# Patient Record
Sex: Female | Born: 1985 | Race: White | Hispanic: No | Marital: Single | Smoking: Never smoker
Health system: Southern US, Community
[De-identification: ages and names within clinical notes are randomized; demographics above are authoritative.]

## PROBLEM LIST (undated history)

## (undated) DIAGNOSIS — K9041 Non-celiac gluten sensitivity: Secondary | ICD-10-CM

## (undated) DIAGNOSIS — E739 Lactose intolerance, unspecified: Secondary | ICD-10-CM

## (undated) HISTORY — PX: TOOTH EXTRACTION: SUR596

## (undated) HISTORY — PX: NO PAST SURGERIES: SHX2092

---

## 2013-08-26 ENCOUNTER — Encounter (HOSPITAL_COMMUNITY): Payer: Self-pay | Admitting: Emergency Medicine

## 2013-08-26 ENCOUNTER — Emergency Department (HOSPITAL_COMMUNITY): Payer: Medicaid Other

## 2013-08-26 ENCOUNTER — Emergency Department (HOSPITAL_COMMUNITY)
Admission: EM | Admit: 2013-08-26 | Discharge: 2013-08-26 | Disposition: A | Discharge status: Home / Self Care | Payer: Medicaid Other | Attending: Emergency Medicine | Admitting: Emergency Medicine

## 2013-08-26 DIAGNOSIS — O200 Threatened abortion: Secondary | ICD-10-CM

## 2013-08-26 DIAGNOSIS — M549 Dorsalgia, unspecified: Secondary | ICD-10-CM | POA: Insufficient documentation

## 2013-08-26 LAB — CBC WITH DIFFERENTIAL/PLATELET
Basophils Relative: 1 % (ref 0–1)
Eosinophils Relative: 2 % (ref 0–5)
Hemoglobin: 13.2 g/dL (ref 12.0–15.0)
Lymphs Abs: 3.1 10*3/uL (ref 0.7–4.0)
MCH: 28.8 pg (ref 26.0–34.0)
MCV: 84.5 fL (ref 78.0–100.0)
Monocytes Absolute: 0.7 10*3/uL (ref 0.1–1.0)
Neutro Abs: 5.5 10*3/uL (ref 1.7–7.7)
Platelets: 288 10*3/uL (ref 150–400)
RBC: 4.59 MIL/uL (ref 3.87–5.11)
RDW: 13.5 % (ref 11.5–15.5)

## 2013-08-26 LAB — URINALYSIS, ROUTINE W REFLEX MICROSCOPIC
Glucose, UA: NEGATIVE mg/dL
Leukocytes, UA: NEGATIVE
Protein, ur: NEGATIVE mg/dL
Urobilinogen, UA: 0.2 mg/dL (ref 0.0–1.0)
pH: 7 (ref 5.0–8.0)

## 2013-08-26 LAB — WET PREP, GENITAL
Clue Cells Wet Prep HPF POC: NONE SEEN
Trich, Wet Prep: NONE SEEN
Yeast Wet Prep HPF POC: NONE SEEN

## 2013-08-26 LAB — ABO/RH

## 2013-08-26 LAB — HCG, QUANTITATIVE, PREGNANCY: hCG, Beta Chain, Quant, S: 1687 m[IU]/mL — ABNORMAL HIGH (ref <5)

## 2013-08-26 LAB — URINE MICROSCOPIC-ADD ON

## 2013-08-26 NOTE — ED Notes (Signed)
Pt c/o vaginal bleeding starting today; pt sts LMP was 07/17/13 and sts postive home pregnancy test on Tuesday; pt sts some clots

## 2013-08-26 NOTE — ED Notes (Signed)
Pt states she began passing clots today, denies any pain with the passage of clots and states she is not having any flow of blood. Pt states she found out on Monday that she was pregnant. Denies any pain at this time.

## 2013-08-26 NOTE — ED Provider Notes (Signed)
CSN: [id number]     Arrival date & time 08/26/13  1459 History   First MD Initiated Contact with Patient 08/26/13 1555     Chief Complaint  Patient presents with  . Vaginal Bleeding   (Consider location/radiation/quality/duration/timing/severity/associated sxs/prior Treatment) The history is provided by the patient. No language interpreter was used.    Ms Rachel Jackson is a 27 yo G2P1 female with a recent positive home pregnancy test who presents with vaginal bleeding. She first noticed some dark brown spotting when she wiped this morning after urinating. A few hours later, she noted some blood clots upon wiping. She called her PCP who told her to come to the ED. She is wearing a pad and has some vaginal bleeding still. Denies abdominal pain or cramping but has mild pressure in lower pelvic and lower back areas. She endorses nausea and increased urinary frequency.  No fever, chills, diarrhea, or constipation. No dysuria, vaginal itching, or rectal bleeding. No concerns for STIs. Her previous pregnancy and delivery was uncomplicated. No history of ectopic pregnancies or miscarriages. She has no chronic medical conditions and only takes a multivitamin.   History reviewed. No pertinent past medical history. History reviewed. No pertinent past surgical history. History reviewed. No pertinent family history. History  Substance Use Topics  . Smoking status: Never Smoker   . Smokeless tobacco: Not on file  . Alcohol Use: Yes     Comment: occ   OB History   Grav Para Term Preterm Abortions TAB SAB Ect Mult Living                 Review of Systems  Constitutional: Negative for fever and chills.  Respiratory: Negative.  Negative for shortness of breath.   Cardiovascular: Negative.  Negative for chest pain.  Gastrointestinal: Negative.  Negative for abdominal pain.  Genitourinary: Positive for vaginal bleeding. Negative for dysuria, vaginal discharge and pelvic pain.  Musculoskeletal:  Positive for back pain.  Neurological: Negative.  Negative for dizziness and light-headedness.    Allergies  Gluten meal; Lactose intolerance (gi); and Yellow dyes (non-tartrazine)  Home Medications   Current Outpatient Rx  Name  Route  Sig  Dispense  Refill  . Multiple Vitamin (MULTIVITAMIN WITH MINERALS) TABS tablet   Oral   Take 1 tablet by mouth 3 (three) times daily.          BP 133/70  Pulse 83  Temp(Src) 98.6 F (37 C) (Oral)  Resp 18  SpO2 97% Physical Exam  Vitals reviewed. Constitutional: She is oriented to person, place, and time. She appears well-developed and well-nourished. No distress.  HENT:  Head: Normocephalic.  Neck: Normal range of motion. Neck supple.  Cardiovascular: Normal rate, regular rhythm, S1 normal and S2 normal.   Pulmonary/Chest: Effort normal and breath sounds normal.  Abdominal: Soft. Normal appearance and bowel sounds are normal. There is no tenderness. There is no rebound and no guarding.  Genitourinary: Vagina normal and uterus normal. No vaginal discharge found.  Dark blood in vaginal vault, no clots or obvious POC. Cervix appears closed, non-tender. No adnexal tenderness or mass.   Musculoskeletal: Normal range of motion.  Lymphadenopathy:    She has no cervical adenopathy.  Neurological: She is alert and oriented to person, place, and time.  Skin: Skin is warm and dry.  Psychiatric: She has a normal mood and affect.    ED Course  Procedures (including critical care time) Labs Review Labs Reviewed  HCG, QUANTITATIVE, PREGNANCY - Abnormal; Notable for  the following:    hCG, Beta Chain, Quant, S 1687 (*)    All other components within normal limits  POCT PREGNANCY, URINE - Abnormal; Notable for the following:    Preg Test, Ur POSITIVE (*)    All other components within normal limits  URINALYSIS, ROUTINE W REFLEX MICROSCOPIC   Results for orders placed during the hospital encounter of 08/26/13  WET PREP, GENITAL      Result  Value Range   Yeast Wet Prep HPF POC NONE SEEN  NONE SEEN   Trich, Wet Prep NONE SEEN  NONE SEEN   Clue Cells Wet Prep HPF POC NONE SEEN  NONE SEEN   WBC, Wet Prep HPF POC FEW (*) NONE SEEN  GC/CHLAMYDIA PROBE AMP      Result Value Range   CT Probe RNA NEGATIVE  NEGATIVE   GC Probe RNA NEGATIVE  NEGATIVE  URINALYSIS, ROUTINE W REFLEX MICROSCOPIC      Result Value Range   Color, Urine YELLOW  YELLOW   APPearance CLEAR  CLEAR   Specific Gravity, Urine 1.010  1.005 - 1.030   pH 7.0  5.0 - 8.0   Glucose, UA NEGATIVE  NEGATIVE mg/dL   Hgb urine dipstick LARGE (*) NEGATIVE   Bilirubin Urine NEGATIVE  NEGATIVE   Ketones, ur NEGATIVE  NEGATIVE mg/dL   Protein, ur NEGATIVE  NEGATIVE mg/dL   Urobilinogen, UA 0.2  0.0 - 1.0 mg/dL   Nitrite NEGATIVE  NEGATIVE   Leukocytes, UA NEGATIVE  NEGATIVE  HCG, QUANTITATIVE, PREGNANCY      Result Value Range   hCG, Beta Chain, Quant, S 1687 (*) <5 mIU/mL  URINE MICROSCOPIC-ADD ON      Result Value Range   Squamous Epithelial / LPF RARE  RARE   WBC, UA 0-2  <3 WBC/hpf   RBC / HPF 0-2  <3 RBC/hpf   Bacteria, UA RARE  RARE  CBC WITH DIFFERENTIAL      Result Value Range   WBC 9.6  4.0 - 10.5 K/uL   RBC 4.59  3.87 - 5.11 MIL/uL   Hemoglobin 13.2  12.0 - 15.0 g/dL   HCT 40.9  81.1 - 91.4 %   MCV 84.5  78.0 - 100.0 fL   MCH 28.8  26.0 - 34.0 pg   MCHC 34.0  30.0 - 36.0 g/dL   RDW 78.2  95.6 - 21.3 %   Platelets 288  150 - 400 K/uL   Neutrophils Relative % 58  43 - 77 %   Lymphocytes Relative 32  12 - 46 %   Monocytes Relative 7  3 - 12 %   Eosinophils Relative 2  0 - 5 %   Basophils Relative 1  0 - 1 %   Neutro Abs 5.5  1.7 - 7.7 K/uL   Lymphs Abs 3.1  0.7 - 4.0 K/uL   Monocytes Absolute 0.7  0.1 - 1.0 K/uL   Eosinophils Absolute 0.2  0.0 - 0.7 K/uL   Basophils Absolute 0.1  0.0 - 0.1 K/uL   RBC Morphology POLYCHROMASIA PRESENT     WBC Morphology ATYPICAL LYMPHOCYTES     Smear Review LARGE PLATELETS PRESENT    POCT PREGNANCY, URINE       Result Value Range   Preg Test, Ur POSITIVE (*) NEGATIVE  ABO/RH      Result Value Range   ABO/RH(D) O POS     No rh immune globuloin NOT A RH IMMUNE GLOBULIN CANDIDATE, PT RH POSITIVE  US Ob Comp Less 14 Wks  08/26/2013   CLINICAL DATA:  Vaginal bleeding  EXAM: OBSTETRIC <14 WK [place] AND TRANSVAGINAL OB US  TECHNIQUE: Both transabdominal and transvaginal ultrasound examinations were performed for complete evaluation of the gestation as well as the maternal uterus, adnexal regions, and pelvic cul-de-sac. Transvaginal technique was performed to assess early pregnancy.  COMPARISON:  None.  FINDINGS: Intrauterine gestational sac: Not visualized  Yolk sac:  None present  Embryo:  Not present  Cardiac Activity: Not available  Maternal uterus/adnexae:  Right ovary measures 2.5 x 1.4 x 1.9 cm. There is 1.3 x 1.0 cyst/follicle.  Left ovary measures 2.7 x 2.3 x 2.1 cm. There is 1.5 x 1.3 cm cyst. Small amount of pelvic free fluid is noted.  IMPRESSION: 1. No intrauterine gestation or gestational sac. Bilateral small ovarian cyst. Small amount of pelvic free fluid is noted. No adnexal mass is noted. Correlation with beta HCG level and followup ultrasound is recommended as clinically warranted.   Electronically Signed   By: Natasha Mead M.D.   On: 08/26/2013 20:03   US Ob Transvaginal  08/28/2013   CLINICAL DATA:  Bleeding, abnormal rise in quantitative beta hcg  EXAM: TRANSVAGINAL OB ULTRASOUND  TECHNIQUE: Transvaginal ultrasound was performed for complete evaluation of the gestation as well as the maternal uterus, adnexal regions, and pelvic cul-de-sac.  COMPARISON:  08/26/13  FINDINGS: Intrauterine gestational sac: V there is an oval anechoic structure within the endometrial canal with a mean diameter of 3.7 mm. If this is a gestational sac it would correlate with estimated gestational age of [redacted] weeks 6 days.  Yolk sac:  Not identified  Embryo:  Not identified  Cardiac Activity: Not identified  Heart Rate: Not  applicable bpm  Maternal uterus/adnexae: Small fluid collection suggesting subchorionic hemorrhage measuring about 1.5 cm. Ovaries appear normal.  IMPRESSION: Probable early gestational sac without any further landmarks. Recommend close ultrasound followup.   Electronically Signed   By: Esperanza Heir M.D.   On: 08/28/2013 15:45   US Ob Transvaginal  08/26/2013   CLINICAL DATA:  Vaginal bleeding  EXAM: OBSTETRIC <14 WK [place] AND TRANSVAGINAL OB US  TECHNIQUE: Both transabdominal and transvaginal ultrasound examinations were performed for complete evaluation of the gestation as well as the maternal uterus, adnexal regions, and pelvic cul-de-sac. Transvaginal technique was performed to assess early pregnancy.  COMPARISON:  None.  FINDINGS: Intrauterine gestational sac: Not visualized  Yolk sac:  None present  Embryo:  Not present  Cardiac Activity: Not available  Maternal uterus/adnexae:  Right ovary measures 2.5 x 1.4 x 1.9 cm. There is 1.3 x 1.0 cyst/follicle.  Left ovary measures 2.7 x 2.3 x 2.1 cm. There is 1.5 x 1.3 cm cyst. Small amount of pelvic free fluid is noted.  IMPRESSION: 1. No intrauterine gestation or gestational sac. Bilateral small ovarian cyst. Small amount of pelvic free fluid is noted. No adnexal mass is noted. Correlation with beta HCG level and followup ultrasound is recommended as clinically warranted.   Electronically Signed   By: Natasha Mead M.D.   On: 08/26/2013 20:03   Imaging Review No results found.  EKG Interpretation   None       MDM  No diagnosis found. 1. Threatened miscarriage  No confirmation of IUP. DDx: early pregnancy vs abortion vs ectopic. Discussed importance of follow up with [facility] in [facility] days for repeat lab studies, possibly repeat ultrasound. Patient acknowledges understanding. Stable for discharge.    Ocie Cornfield  Hector Shade, PA-C 08/30/13 1610

## 2013-08-28 ENCOUNTER — Inpatient Hospital Stay (HOSPITAL_COMMUNITY): Payer: Medicaid Other

## 2013-08-28 ENCOUNTER — Encounter (HOSPITAL_COMMUNITY): Payer: Self-pay | Admitting: *Deleted

## 2013-08-28 ENCOUNTER — Inpatient Hospital Stay (HOSPITAL_COMMUNITY)
Admission: AD | Admit: 2013-08-28 | Discharge: 2013-08-28 | Disposition: A | Discharge status: Home / Self Care | Payer: Medicaid Other | Source: Ambulatory Visit | Attending: Obstetrics & Gynecology | Admitting: Obstetrics & Gynecology

## 2013-08-28 DIAGNOSIS — O4690 Antepartum hemorrhage, unspecified, unspecified trimester: Secondary | ICD-10-CM

## 2013-08-28 DIAGNOSIS — O209 Hemorrhage in early pregnancy, unspecified: Secondary | ICD-10-CM | POA: Insufficient documentation

## 2013-08-28 LAB — URINE MICROSCOPIC-ADD ON

## 2013-08-28 LAB — URINALYSIS, ROUTINE W REFLEX MICROSCOPIC
Bilirubin Urine: NEGATIVE
Glucose, UA: NEGATIVE mg/dL
Ketones, ur: NEGATIVE mg/dL
Protein, ur: NEGATIVE mg/dL

## 2013-08-28 LAB — POCT PREGNANCY, URINE: Preg Test, Ur: POSITIVE — AB

## 2013-08-28 NOTE — MAU Provider Note (Signed)
History     CSN: [id number]  Arrival date and time: 08/28/13 1217   First Provider Initiated Contact with Patient 08/28/13 1257      Chief Complaint  Patient presents with  . Vaginal Bleeding   HPI Ms. Rachel Jackson is a 27 y.o. G2P1001 at [redacted]w[redacted]d who presents to MAU today with complaint of vaginal bleeding. The patient was seen at [facility] on 08/26/13 for spotting when she wiped and +HPT. The patient had a full rule-out ectopic work-up at that time. [place] did not show anything in the uterus or adnexa. She states that bleeding first started Friday morning. She states that since the visit to [facility] she has noted a slight increase in her bleeding. She is having to wear a panty liner and notes scant bleeding on the pad. She denies abdominal pain, fever, UTI symptoms, vaginal discharge, dizziness or weakness.  Patient states that she was told to come to MAU today for her 48 hour quant hCG.   OB History   Grav Para Term Preterm Abortions TAB SAB Ect Mult Living   2 1 1       1       Past Medical History  Diagnosis Date  . Medical history non-contributory     Past Surgical History  Procedure Laterality Date  . No past surgeries      Family History  Problem Relation Age of Onset  . Hypertension Father     History  Substance Use Topics  . Smoking status: Never Smoker   . Smokeless tobacco: Not on file  . Alcohol Use: Yes     Comment: occ    Allergies:  Allergies  Allergen Reactions  . Gluten Meal Other (See Comments)    Gi upset  . Lactose Intolerance (Gi) Other (See Comments)    Gi upset  . Yellow Dyes (Non-Tartrazine) Hives and Swelling    Yellow #5, lips swelling, palms of hands & feet and ears burn    Prescriptions prior to admission  Medication Sig Dispense Refill  . Multiple Vitamin (MULTIVITAMIN WITH MINERALS) TABS tablet Take 1 tablet by mouth 3 (three) times daily.        Review of Systems  Constitutional: Negative for fever.  Gastrointestinal: Positive for  nausea. Negative for vomiting, abdominal pain, diarrhea and constipation.  Genitourinary: Negative for dysuria, urgency and frequency.       + vaginal bleeding Neg - vaginal discharge  Neurological: Negative for dizziness and weakness.   Physical Exam   Blood pressure 110/65, pulse 87, temperature 98.7 F (37.1 C), resp. rate 18, last menstrual period 07/17/2013.  Physical Exam  Constitutional: She is oriented to person, place, and time. She appears well-developed and well-nourished. No distress.  HENT:  Head: Normocephalic and atraumatic.  Cardiovascular: Normal rate, regular rhythm and normal heart sounds.   Respiratory: Effort normal and breath sounds normal. No respiratory distress.  GI: Soft. Bowel sounds are normal. She exhibits no distension and no mass. There is no tenderness. There is no rebound and no guarding.  Neurological: She is alert and oriented to person, place, and time.  Skin: Skin is warm and dry. No erythema.  Psychiatric: She has a normal mood and affect.   Results for orders placed during the hospital encounter of 08/28/13 (from the past 24 hour(s))  URINALYSIS, ROUTINE W REFLEX MICROSCOPIC     Status: Abnormal   Collection Time    08/28/13 12:30 PM      Result Value Range   Color,  Urine YELLOW  YELLOW   APPearance HAZY (*) CLEAR   Specific Gravity, Urine 1.025  1.005 - 1.030   pH 7.0  5.0 - 8.0   Glucose, UA NEGATIVE  NEGATIVE mg/dL   Hgb urine dipstick LARGE (*) NEGATIVE   Bilirubin Urine NEGATIVE  NEGATIVE   Ketones, ur NEGATIVE  NEGATIVE mg/dL   Protein, ur NEGATIVE  NEGATIVE mg/dL   Urobilinogen, UA 0.2  0.0 - 1.0 mg/dL   Nitrite NEGATIVE  NEGATIVE   Leukocytes, UA NEGATIVE  NEGATIVE  URINE MICROSCOPIC-ADD ON     Status: Abnormal   Collection Time    08/28/13 12:30 PM      Result Value Range   Squamous Epithelial / LPF FEW (*) RARE   WBC, UA 0-2  <3 WBC/hpf   RBC / HPF 21-50  <3 RBC/hpf   Bacteria, UA MANY (*) RARE  POCT PREGNANCY, URINE      Status: Abnormal   Collection Time    08/28/13 12:40 PM      Result Value Range   Preg Test, Ur POSITIVE (*) NEGATIVE  HCG, QUANTITATIVE, PREGNANCY     Status: Abnormal   Collection Time    08/28/13  1:09 PM      Result Value Range   hCG, Beta Chain, Quant, S 2365 (*) <5 mIU/mL   US Ob Comp Less 14 Wks  08/26/2013   CLINICAL DATA:  Vaginal bleeding  EXAM: OBSTETRIC <14 WK [place] AND TRANSVAGINAL OB US  TECHNIQUE: Both transabdominal and transvaginal ultrasound examinations were performed for complete evaluation of the gestation as well as the maternal uterus, adnexal regions, and pelvic cul-de-sac. Transvaginal technique was performed to assess early pregnancy.  COMPARISON:  None.  FINDINGS: Intrauterine gestational sac: Not visualized  Yolk sac:  None present  Embryo:  Not present  Cardiac Activity: Not available  Maternal uterus/adnexae:  Right ovary measures 2.5 x 1.4 x 1.9 cm. There is 1.3 x 1.0 cyst/follicle.  Left ovary measures 2.7 x 2.3 x 2.1 cm. There is 1.5 x 1.3 cm cyst. Small amount of pelvic free fluid is noted.  IMPRESSION: 1. No intrauterine gestation or gestational sac. Bilateral small ovarian cyst. Small amount of pelvic free fluid is noted. No adnexal mass is noted. Correlation with beta HCG level and followup ultrasound is recommended as clinically warranted.   Electronically Signed   By: Natasha Mead M.D.   On: 08/26/2013 20:03   US Ob Transvaginal  08/28/2013   CLINICAL DATA:  Bleeding, abnormal rise in quantitative beta hcg  EXAM: TRANSVAGINAL OB ULTRASOUND  TECHNIQUE: Transvaginal ultrasound was performed for complete evaluation of the gestation as well as the maternal uterus, adnexal regions, and pelvic cul-de-sac.  COMPARISON:  08/26/13  FINDINGS: Intrauterine gestational sac: V there is an oval anechoic structure within the endometrial canal with a mean diameter of 3.7 mm. If this is a gestational sac it would correlate with estimated gestational age of [redacted] weeks 6 days.  Yolk sac:   Not identified  Embryo:  Not identified  Cardiac Activity: Not identified  Heart Rate: Not applicable bpm  Maternal uterus/adnexae: Small fluid collection suggesting subchorionic hemorrhage measuring about 1.5 cm. Ovaries appear normal.  IMPRESSION: Probable early gestational sac without any further landmarks. Recommend close ultrasound followup.   Electronically Signed   By: Esperanza Heir M.D.   On: 08/28/2013 15:45   US Ob Transvaginal  08/26/2013   CLINICAL DATA:  Vaginal bleeding  EXAM: OBSTETRIC <14 WK [place] AND TRANSVAGINAL OB  US  TECHNIQUE: Both transabdominal and transvaginal ultrasound examinations were performed for complete evaluation of the gestation as well as the maternal uterus, adnexal regions, and pelvic cul-de-sac. Transvaginal technique was performed to assess early pregnancy.  COMPARISON:  None.  FINDINGS: Intrauterine gestational sac: Not visualized  Yolk sac:  None present  Embryo:  Not present  Cardiac Activity: Not available  Maternal uterus/adnexae:  Right ovary measures 2.5 x 1.4 x 1.9 cm. There is 1.3 x 1.0 cyst/follicle.  Left ovary measures 2.7 x 2.3 x 2.1 cm. There is 1.5 x 1.3 cm cyst. Small amount of pelvic free fluid is noted.  IMPRESSION: 1. No intrauterine gestation or gestational sac. Bilateral small ovarian cyst. Small amount of pelvic free fluid is noted. No adnexal mass is noted. Correlation with beta HCG level and followup ultrasound is recommended as clinically warranted.   Electronically Signed   By: Natasha Mead M.D.   On: 08/26/2013 20:03     MAU Course  Procedures None  MDM Follow-up quant hCG today.  Discussed inadequate rise in quant hCG with Dr. Marice Potter. Order [place] today  Assessment and Plan  A: Probably IUGS without YS or FP Vaginal bleeding in pregnancy, first trimester  P: Discharge home Bleeding precautions discussed Patient to return to MAU in 48 hours for follow-up labs Patient may return to MAU as needed sooner if symptoms worsen  Freddi Starr, PA-C  08/28/2013, 3:15 PM

## 2013-08-28 NOTE — MAU Note (Signed)
C/o increased vaginal bleeding since Friday; denies cramping;

## 2013-08-29 ENCOUNTER — Inpatient Hospital Stay (HOSPITAL_COMMUNITY)
Admission: AD | Admit: 2013-08-29 | Discharge: 2013-08-29 | Disposition: A | Discharge status: Home / Self Care | Payer: Medicaid Other | Source: Ambulatory Visit | Attending: Obstetrics & Gynecology | Admitting: Obstetrics & Gynecology

## 2013-08-29 ENCOUNTER — Encounter (HOSPITAL_COMMUNITY): Payer: Self-pay

## 2013-08-29 DIAGNOSIS — M545 Low back pain, unspecified: Secondary | ICD-10-CM | POA: Insufficient documentation

## 2013-08-29 DIAGNOSIS — O200 Threatened abortion: Secondary | ICD-10-CM | POA: Insufficient documentation

## 2013-08-29 DIAGNOSIS — R109 Unspecified abdominal pain: Secondary | ICD-10-CM | POA: Insufficient documentation

## 2013-08-29 HISTORY — DX: Lactose intolerance, unspecified: E73.9

## 2013-08-29 HISTORY — DX: Non-celiac gluten sensitivity: K90.41

## 2013-08-29 LAB — CBC
Hemoglobin: 13.2 g/dL (ref 12.0–15.0)
MCH: 28.4 pg (ref 26.0–34.0)
Platelets: 239 10*3/uL (ref 150–400)
RBC: 4.64 MIL/uL (ref 3.87–5.11)
RDW: 13.4 % (ref 11.5–15.5)
WBC: 10.6 10*3/uL — ABNORMAL HIGH (ref 4.0–10.5)

## 2013-08-29 MED ORDER — PROMETHAZINE HCL 25 MG PO TABS: 25.0000 mg | ORAL_TABLET | Freq: Four times a day (QID) | ORAL | Status: DC | PRN

## 2013-08-29 MED ORDER — ACETAMINOPHEN-CODEINE #3 300-30 MG PO TABS: 1.0000 | ORAL_TABLET | Freq: Four times a day (QID) | ORAL | Status: DC | PRN

## 2013-08-29 NOTE — MAU Note (Signed)
Patient states she went to the bathroom this am and passed a flat blood clot about the size of a ping pong ball. States she has a little spotting. Has been having abdominal cramping.

## 2013-08-29 NOTE — MAU Note (Signed)
Pt states between 08-1029 this am passed flat ping pong ball sized clot. No other clots noted.

## 2013-08-29 NOTE — MAU Provider Note (Signed)
History     CSN: [id number]  Arrival date and time: 08/29/13 1203   First Provider Initiated Contact with Patient 08/29/13 1253      Chief Complaint  Patient presents with  . Abdominal Pain  . Vaginal Bleeding   HPI Comments: Rachel Jackson 27 y.o. G2P1001 presents to MAU today after passing a ping pong ball sized clot and having some pain in lower mid back that is a 2/10. She did not take any medications. She has been on ectopic precautions. Quant 10/24 was 1687 and 10/26 was 2365     Patient is a 27 y.o. female presenting with abdominal pain and vaginal bleeding.  Abdominal Pain The primary symptoms of the illness include abdominal pain and vaginal bleeding.  Additional symptoms associated with the illness include back pain.  Vaginal Bleeding Associated symptoms include abdominal pain.      Past Medical History  Diagnosis Date  . Medical history non-contributory   . Gluten intolerance   . Lactose intolerance     Past Surgical History  Procedure Laterality Date  . No past surgeries      Family History  Problem Relation Age of Onset  . Hypertension Father     History  Substance Use Topics  . Smoking status: Never Smoker   . Smokeless tobacco: Never Used  . Alcohol Use: Yes     Comment: occ    Allergies:  Allergies  Allergen Reactions  . Gluten Meal Other (See Comments)    Gi upset  . Lactose Intolerance (Gi) Other (See Comments)    Gi upset  . Yellow Dyes (Non-Tartrazine) Hives and Swelling    Yellow #5, lips swelling, palms of hands & feet and ears burn    Prescriptions prior to admission  Medication Sig Dispense Refill  . Multiple Vitamin (MULTIVITAMIN WITH MINERALS) TABS tablet Take 1 tablet by mouth 3 (three) times daily.        Review of Systems  Constitutional: Negative.   HENT: Negative.   Respiratory: Negative.   Cardiovascular: Negative.   Gastrointestinal: Positive for abdominal pain.  Genitourinary: Positive for vaginal  bleeding.       Vaginal bleeding and large clots  Musculoskeletal: Positive for back pain.  Skin: Negative.    Physical Exam   Blood pressure 120/66, pulse 85, temperature 98.4 F (36.9 C), temperature source Oral, resp. rate 16, height 5' 4.5" (1.638 m), weight 151 lb (68.493 kg), last menstrual period 07/17/2013, SpO2 100.00%.  Physical Exam  Constitutional: She is oriented to person, place, and time. She appears well-developed and well-nourished. No distress.  HENT:  Head: Normocephalic and atraumatic.  Eyes: Pupils are equal, round, and reactive to light.  Cardiovascular: Normal rate, regular rhythm and normal heart sounds.   Respiratory: Effort normal.  GI: She exhibits no distension. There is no tenderness. There is no rebound.  Genitourinary:  External: bloody Vagina: dark red blood/ moderate amount Cervic: dilated 1 cm Biman: No uterus tenderness  Musculoskeletal: She exhibits tenderness.  Low sacrum at midline  Neurological: She is alert and oriented to person, place, and time.  Skin: Skin is warm.  Psychiatric: She has a normal mood and affect. Her behavior is normal. Judgment and thought content normal.   Results for orders placed during the hospital encounter of 08/29/13 (from the past 24 hour(s))  CBC     Status: Abnormal   Collection Time    08/29/13  1:15 PM      Result Value Range   WBC  10.6 (*) 4.0 - 10.5 K/uL   RBC 4.64  3.87 - 5.11 MIL/uL   Hemoglobin 13.2  12.0 - 15.0 g/dL   HCT 96.2  95.2 - 84.1 %   MCV 82.5  78.0 - 100.0 fL   MCH 28.4  26.0 - 34.0 pg   MCHC 34.5  30.0 - 36.0 g/dL   RDW 32.4  40.1 - 02.7 %   Platelets 239  150 - 400 K/uL     MAU Course  Procedures  MDM  Repeat CBC which is stable. Has scheduled quant for tomorrow  Assessment and Plan   A: Threatened Miscarriage  P: Continue ectopic precautions Follow up tomorrow with Quant Repeat Ultrasound in 7 days Tylenol # 3 and phenergan for pain  Carolynn Serve 08/29/2013, 1:48 PM

## 2013-08-30 ENCOUNTER — Inpatient Hospital Stay (HOSPITAL_COMMUNITY)
Admission: AD | Admit: 2013-08-30 | Discharge: 2013-08-30 | Disposition: A | Discharge status: Home / Self Care | Payer: Medicaid Other | Source: Ambulatory Visit | Attending: Obstetrics & Gynecology | Admitting: Obstetrics & Gynecology

## 2013-08-30 DIAGNOSIS — O039 Complete or unspecified spontaneous abortion without complication: Secondary | ICD-10-CM

## 2013-08-30 LAB — HCG, QUANTITATIVE, PREGNANCY: hCG, Beta Chain, Quant, S: 833 m[IU]/mL — ABNORMAL HIGH (ref <5)

## 2013-08-30 NOTE — MAU Provider Note (Signed)
  History     CSN: [id number]  Arrival date and time: 08/30/13 1028   None     Chief Complaint  Patient presents with  . Follow-up   HPI Pt here for f/u HCG after being seen 10/26- ultrasound showed ?IUGS no embryo.  Pt passed large clot after  Her appointment today.  Pt continues to have some bleeding and cramping. Pt had HCG 1687 4 days ago            HCG 2365  2 days ago             HCG 833 today  RN note Registered Nurse Signed MAU Note Service date: 08/30/2013 11:51 AM   Here for follow up. States passed a large (fist sized) clot after she left yesterday, continues to pass small clots. Cramping continues    Past Medical History  Diagnosis Date  . Medical history non-contributory   . Gluten intolerance   . Lactose intolerance     Past Surgical History  Procedure Laterality Date  . No past surgeries      Family History  Problem Relation Age of Onset  . Hypertension Father     History  Substance Use Topics  . Smoking status: Never Smoker   . Smokeless tobacco: Never Used  . Alcohol Use: Yes     Comment: occ    Allergies:  Allergies  Allergen Reactions  . Gluten Meal Other (See Comments)    Gi upset  . Lactose Intolerance (Gi) Other (See Comments)    Gi upset  . Yellow Dyes (Non-Tartrazine) Hives and Swelling    Yellow #5, lips swelling, palms of hands & feet and ears burn    Prescriptions prior to admission  Medication Sig Dispense Refill  . acetaminophen-codeine (TYLENOL #3) 300-30 MG per tablet Take 1-2 tablets by mouth every 6 (six) hours as needed for pain.  15 tablet  0  . Multiple Vitamin (MULTIVITAMIN WITH MINERALS) TABS tablet Take 1 tablet by mouth 3 (three) times daily.      . promethazine (PHENERGAN) 25 MG tablet Take 1 tablet (25 mg total) by mouth every 6 (six) hours as needed for nausea.  30 tablet  0    ROS Physical Exam   Blood pressure 122/72, pulse 68, temperature 99 F (37.2 C), temperature source Oral, resp. rate 16, last  menstrual period 07/17/2013.  Physical Exam  Vitals reviewed. Constitutional: She is oriented to person, place, and time. She appears well-developed and well-nourished.  tearful  Neck: Normal range of motion. Neck supple.  Cardiovascular: Normal rate.   Musculoskeletal: Normal range of motion.  Neurological: She is alert and oriented to person, place, and time.  Skin: Skin is warm and dry.  Psychiatric: She has a normal mood and affect.    MAU Course  Procedures Results for orders placed during the hospital encounter of 08/30/13 (from the past 24 hour(s))  HCG, QUANTITATIVE, PREGNANCY     Status: Abnormal   Collection Time    08/30/13 10:50 AM      Result Value Range   hCG, Beta Chain, Quant, S 833 (*) <5 mIU/mL   Discussed with patient SAB F/u in 1 week at [facility] for repeat HCG   Assessment and Plan  SAB- f/u in 1 week at [facility] for repeat HCG Return sooner if increase in bleeding or pain  LINEBERRY,SUSAN 08/30/2013, 11:54 AM

## 2013-08-30 NOTE — MAU Note (Signed)
Here for follow up.  States passed a large (fist sized) clot after she left yesterday, continues to pass small clots.  Cramping continues.

## 2013-08-30 NOTE — MAU Note (Signed)
Radiology called, [place] cancelled due to current diagnosis per Wende Bushy NP

## 2013-08-31 NOTE — ED Provider Notes (Signed)
Medical screening examination/treatment/procedure(s) were performed by non-physician practitioner and as supervising physician I was immediately available for consultation/collaboration.  EKG Interpretation   None         Rolan Bucco, MD 08/31/13 1617

## 2013-09-01 NOTE — MAU Provider Note (Signed)
Attestation of Attending Supervision of Advanced Practitioner (PA/CNM/NP): Evaluation and management procedures were performed by the Advanced Practitioner under my supervision and collaboration.  I have reviewed the Advanced Practitioner's note and chart, and I agree with the management and plan.  Giulio Bertino, MD, FACOG Attending Obstetrician & Gynecologist Faculty Practice, [facility]  

## 2013-09-04 NOTE — MAU Provider Note (Signed)
Attestation of Attending Supervision of Advanced Practitioner (CNM/NP): Evaluation and management procedures were performed by the Advanced Practitioner under my supervision and collaboration. I have reviewed the Advanced Practitioner's note and chart, and I agree with the management and plan.  Moncia Annas H. 5:04 PM  

## 2013-09-05 ENCOUNTER — Other Ambulatory Visit: Payer: Medicaid Other

## 2013-09-05 DIAGNOSIS — O039 Complete or unspecified spontaneous abortion without complication: Secondary | ICD-10-CM

## 2013-09-06 LAB — HCG, QUANTITATIVE, PREGNANCY: hCG, Beta Chain, Quant, S: 39.9 m[IU]/mL

## 2013-09-20 ENCOUNTER — Encounter: Payer: Self-pay | Admitting: Obstetrics & Gynecology

## 2013-09-20 ENCOUNTER — Other Ambulatory Visit (HOSPITAL_COMMUNITY)
Admission: RE | Admit: 2013-09-20 | Discharge: 2013-09-20 | Disposition: A | Discharge status: Home / Self Care | Payer: Medicaid Other | Source: Ambulatory Visit | Attending: Obstetrics & Gynecology | Admitting: Obstetrics & Gynecology

## 2013-09-20 ENCOUNTER — Ambulatory Visit (INDEPENDENT_AMBULATORY_CARE_PROVIDER_SITE_OTHER): Payer: Medicaid Other | Admitting: Obstetrics & Gynecology

## 2013-09-20 VITALS — BP 129/70 | HR 80 | Ht 64.0 in | Wt 153.0 lb

## 2013-09-20 DIAGNOSIS — Z0000 Encounter for general adult medical examination without abnormal findings: Secondary | ICD-10-CM

## 2013-09-20 DIAGNOSIS — R87810 Cervical high risk human papillomavirus (HPV) DNA test positive: Secondary | ICD-10-CM | POA: Insufficient documentation

## 2013-09-20 DIAGNOSIS — O039 Complete or unspecified spontaneous abortion without complication: Secondary | ICD-10-CM

## 2013-09-20 DIAGNOSIS — Z113 Encounter for screening for infections with a predominantly sexual mode of transmission: Secondary | ICD-10-CM | POA: Insufficient documentation

## 2013-09-20 DIAGNOSIS — Z01419 Encounter for gynecological examination (general) (routine) without abnormal findings: Secondary | ICD-10-CM | POA: Insufficient documentation

## 2013-09-20 DIAGNOSIS — O021 Missed abortion: Secondary | ICD-10-CM

## 2013-09-20 DIAGNOSIS — Z1151 Encounter for screening for human papillomavirus (HPV): Secondary | ICD-10-CM | POA: Insufficient documentation

## 2013-09-20 NOTE — Progress Notes (Signed)
  Subjective:    Patient ID: Rachel Jackson, female    DOB: 1986/06/19, 27 y.o.   MRN: [id number]  HPI 27 yo G2P1 (35 yo son Colon Branch) who is here 3 weeks after a miscarriage. She has no complaints and reports that her bleeding stopped 2 weeks ago. She is using withdrawal for birth control. She would like a pregnancy and is taking MVIs daily.   Review of Systems Pap is due She declines a flu vaccine.    Objective:   Physical Exam  Normal appearing cervix NSSA, NT, mobile, normal adnexa      Assessment & Plan:  Miscarriage- recheck [facility] Preventative- pap smear with cultures and cotesting Rec condoms for 3 cycles Continue MVIs

## 2013-09-20 NOTE — Progress Notes (Signed)
  Subjective:    Patient ID: Rachel Jackson, female    DOB: August 15, 1986, 28 y.o.   MRN: [id number]  HPI  27 yo S W G2P1A1 (4 yo son Colon Branch) who is here today for follow up/QBHCG after a miscarriage. She was seen in the MAU 3 weeks ago with VB and decreasing HGC. She reports that she has had no bleeding for about 2 weeks. She has had sex but used withdrawal. She would like a pregnancy soon. On MVIs.    Review of Systems Pap due She declines a flu vaccine.    Objective:   Physical Exam        Assessment & Plan:  Preventative care- pap smear done today with cotesting and cultures Miscarriage- recheck [facility] today Rec condoms for 3 cycles prior to pregnancy.

## 2013-09-21 LAB — HCG, QUANTITATIVE, PREGNANCY: hCG, Beta Chain, Quant, S: <2 m[IU]/mL

## 2013-11-03 NOTE — L&D Delivery Note (Signed)
Delivery Note At 3:03 PM a viable female was delivered via Vaginal, Spontaneous Delivery (Presentation: Left Occiput Anterior).  APGAR: 8, 9; weight .   Placenta status: Intact, Spontaneous.  Cord:  with the following complications: None.    Anesthesia: None  Episiotomy: None Lacerations: 2nd degree, 1st degree Suture Repair: 3.0 vicryl rapide Est. Blood Loss (mL): 450cc  Mom to postpartum.  Baby to Couplet care / Skin to Skin.  Called to delivery. Mother pushed over 15 min. Infant delivered to maternal abdomen. Delayed cord clamping performed. Cord clamped and cut. Active management of 3rd stage with traction and Pitocin. Placenta delivered intact with 3v cord. Tears repaired with 3.0 vicryl rapide in usual manner. EBL 450cc. Counts correct. Hemostatic.   Melancon, Caleb G 06/10/2014, 3:44 PM  I was present for delivery, LOA with compound hand and nuchal/body cord.  Posterior shoulder delivered first 2/2 compound hand and anterior shoulder delivered subsequently without difficulty.  2nd degree lac repaired with 3.0 vicryl rapide.  800mcg cytotec given PV, fundus firm.  Perry MountACOSTA,Rolande Moe ROCIO, MD 6:26 PM

## 2013-11-24 ENCOUNTER — Ambulatory Visit (INDEPENDENT_AMBULATORY_CARE_PROVIDER_SITE_OTHER): Payer: Medicaid Other | Admitting: Obstetrics and Gynecology

## 2013-11-24 ENCOUNTER — Encounter: Payer: Self-pay | Admitting: Obstetrics and Gynecology

## 2013-11-24 VITALS — BP 123/74 | Wt 150.0 lb

## 2013-11-24 DIAGNOSIS — R87610 Atypical squamous cells of undetermined significance on cytologic smear of cervix (ASC-US): Secondary | ICD-10-CM | POA: Insufficient documentation

## 2013-11-24 DIAGNOSIS — Z3480 Encounter for supervision of other normal pregnancy, unspecified trimester: Secondary | ICD-10-CM | POA: Insufficient documentation

## 2013-11-24 NOTE — Patient Instructions (Signed)
Pregnancy - First Trimester During sexual intercourse, millions of sperm go into the vagina. Only 1 sperm will penetrate and fertilize the female egg while it is in the Fallopian tube. One week later, the fertilized egg implants into the wall of the uterus. An embryo begins to develop into a baby. At 6 to 8 weeks, the eyes and face are formed and the heartbeat can be seen on ultrasound. At the end of 12 weeks (first trimester), all the baby's organs are formed. Now that you are pregnant, you will want to do everything you can to have a healthy baby. Two of the most important things are to get good prenatal care and follow your caregiver's instructions. Prenatal care is all the medical care you receive before the baby's birth. It is given to prevent, find, and treat problems during the pregnancy and childbirth. PRENATAL EXAMS  During prenatal visits, your weight, blood pressure, and urine are checked. This is done to make sure you are healthy and progressing normally during the pregnancy.  A pregnant woman should gain 25 to 35 pounds during the pregnancy. However, if you are overweight or underweight, your caregiver will advise you regarding your weight.  Your caregiver will ask and answer questions for you.  Blood work, cervical cultures, other necessary tests, and a Pap test are done during your prenatal exams. These tests are done to check on your health and the probable health of your baby. Tests are strongly recommended and done for HIV with your permission. This is the virus that causes AIDS. These tests are done because medicines can be given to help prevent your baby from being born with this infection should you have been infected without knowing it. Blood work is also used to find out your blood type, previous infections, and follow your blood levels (hemoglobin).  Low hemoglobin (anemia) is common during pregnancy. Iron and vitamins are given to help prevent this. Later in the pregnancy,  blood tests for diabetes will be done along with any other tests if any problems develop.  You may need other tests to make sure you and the baby are doing well. CHANGES DURING THE FIRST TRIMESTER  Your body goes through many changes during pregnancy. They vary from person to person. Talk to your caregiver about changes you notice and are concerned about. Changes can include:  Your menstrual period stops.  The egg and sperm carry the genes that determine what you look like. Genes from you and your partner are forming a baby. The female genes determine whether the baby is a boy or a girl.  Your body increases in girth and you may feel bloated.  Feeling sick to your stomach (nauseous) and throwing up (vomiting). If the vomiting is uncontrollable, call your caregiver.  Your breasts will begin to enlarge and become tender.  Your nipples may stick out more and become darker.  The need to urinate more. Painful urination may mean you have a bladder infection.  Tiring easily.  Loss of appetite.  Cravings for certain kinds of food.  At first, you may gain or lose a couple of pounds.  You may have changes in your emotions from day to day (excited to be pregnant or concerned something may go wrong with the pregnancy and baby).  You may have more vivid and strange dreams. HOME CARE INSTRUCTIONS   It is very important to avoid all smoking, alcohol and non-prescribed drugs during your pregnancy. These affect the formation and growth of the baby.   Avoid chemicals while pregnant to ensure the delivery of a healthy infant.  Start your prenatal visits by the 12th week of pregnancy. They are usually scheduled monthly at first, then more often in the last 2 months before delivery. Keep your caregiver's appointments. Follow your caregiver's instructions regarding medicine use, blood and lab tests, exercise, and diet.  During pregnancy, you are providing food for you and your baby. Eat regular,  well-balanced meals. Choose foods such as meat, fish, milk and other low fat dairy products, vegetables, fruits, and whole-grain breads and cereals. Your caregiver will tell you of the ideal weight gain.  You can help morning sickness by keeping soda crackers at the bedside. Eat a couple before arising in the morning. You may want to use the crackers without salt on them.  Eating 4 to 5 small meals rather than 3 large meals a day also may help the nausea and vomiting.  Drinking liquids between meals instead of during meals also seems to help nausea and vomiting.  A physical sexual relationship may be continued throughout pregnancy if there are no other problems. Problems may be early (premature) leaking of amniotic fluid from the membranes, vaginal bleeding, or belly (abdominal) pain.  Exercise regularly if there are no restrictions. Check with your caregiver or physical therapist if you are unsure of the safety of some of your exercises. Greater weight gain will occur in the last 2 trimesters of pregnancy. Exercising will help:  Control your weight.  Keep you in shape.  Prepare you for labor and delivery.  Help you lose your pregnancy weight after you deliver your baby.  Wear a good support or jogging bra for breast tenderness during pregnancy. This may help if worn during sleep too.  Ask when prenatal classes are available. Begin classes when they are offered.  Do not use hot tubs, steam rooms, or saunas.  Wear your seat belt when driving. This protects you and your baby if you are in an accident.  Avoid raw meat, uncooked cheese, cat litter boxes, and soil used by cats throughout the pregnancy. These carry germs that can cause birth defects in the baby.  The first trimester is a good time to visit your dentist for your dental health. Getting your teeth cleaned is okay. Use a softer toothbrush and brush gently during pregnancy.  Ask for help if you have financial, counseling, or  nutritional needs during pregnancy. Your caregiver will be able to offer counseling for these needs as well as refer you for other special needs.  Do not take any medicines or herbs unless told by your caregiver.  Inform your caregiver if there is any mental or physical domestic violence.  Make a list of emergency phone numbers of family, friends, hospital, and police and fire departments.  Write down your questions. Take them to your prenatal visit.  Do not douche.  Do not cross your legs.  If you have to stand for long periods of time, rotate you feet or take small steps in a circle.  You may have more vaginal secretions that may require a sanitary pad. Do not use tampons or scented sanitary pads. MEDICINES AND DRUG USE IN PREGNANCY  Take prenatal vitamins as directed. The vitamin should contain 1 milligram of folic acid. Keep all vitamins out of reach of children. Only a couple vitamins or tablets containing iron may be fatal to a baby or young child when ingested.  Avoid use of all medicines, including herbs, over-the-counter medicines, not   prescribed or suggested by your caregiver. Only take over-the-counter or prescription medicines for pain, discomfort, or fever as directed by your caregiver. Do not use aspirin, ibuprofen, or naproxen unless directed by your caregiver.  Let your caregiver also know about herbs you may be using.  Alcohol is related to a number of birth defects. This includes fetal alcohol syndrome. All alcohol, in any form, should be avoided completely. Smoking will cause low birth rate and premature babies.  Street or illegal drugs are very harmful to the baby. They are absolutely forbidden. A baby born to an addicted mother will be addicted at birth. The baby will go through the same withdrawal an adult does.  Let your caregiver know about any medicines that you have to take and for what reason you take them. SEEK MEDICAL CARE IF:  You have any concerns or  worries during your pregnancy. It is better to call with your questions if you feel they cannot wait, rather than worry about them. SEEK IMMEDIATE MEDICAL CARE IF:   An unexplained oral temperature above 102 F (38.9 C) develops, or as your caregiver suggests.  You have leaking of fluid from the vagina (birth canal). If leaking membranes are suspected, take your temperature and inform your caregiver of this when you call.  There is vaginal spotting or bleeding. Notify your caregiver of the amount and how many pads are used.  You develop a bad smelling vaginal discharge with a change in the color.  You continue to feel sick to your stomach (nauseated) and have no relief from remedies suggested. You vomit blood or coffee ground-like materials.  You lose more than 2 pounds of weight in 1 week.  You gain more than 2 pounds of weight in 1 week and you notice swelling of your face, hands, feet, or legs.  You gain 5 pounds or more in 1 week (even if you do not have swelling of your hands, face, legs, or feet).  You get exposed to [place] measles and have never had them.  You are exposed to fifth disease or chickenpox.  You develop belly (abdominal) pain. Round ligament discomfort is a common non-cancerous (benign) cause of abdominal pain in pregnancy. Your caregiver still must evaluate this.  You develop headache, fever, diarrhea, pain with urination, or shortness of breath.  You fall or are in a car accident or have any kind of trauma.  There is mental or physical violence in your home. Document Released: 10/14/2001 Document Revised: 07/14/2012 Document Reviewed: 04/17/2009 [facility] Patient Information 2014 [place].  Contraception Choices Contraception (birth control) is the use of any methods or devices to prevent pregnancy. Below are some methods to help avoid pregnancy. HORMONAL METHODS   Contraceptive implant This is a thin, plastic tube containing progesterone hormone. It  does not contain estrogen hormone. Your health care provider inserts the tube in the inner part of the upper arm. The tube can remain in place for up to 3 years. After 3 years, the implant must be removed. The implant prevents the ovaries from releasing an egg (ovulation), thickens the cervical mucus to prevent sperm from entering the uterus, and thins the lining of the inside of the uterus.  Progesterone-only injections These injections are given every 3 months by your health care provider to prevent pregnancy. This synthetic progesterone hormone stops the ovaries from releasing eggs. It also thickens cervical mucus and changes the uterine lining. This makes it harder for sperm to survive in the uterus.  Birth  control pills These pills contain estrogen and progesterone hormone. They work by preventing the ovaries from releasing eggs (ovulation). They also cause the cervical mucus to thicken, preventing the sperm from entering the uterus. Birth control pills are prescribed by a health care provider.Birth control pills can also be used to treat heavy periods.  Minipill This type of birth control pill contains only the progesterone hormone. They are taken every day of each month and must be prescribed by your health care provider.  Birth control patch The patch contains hormones similar to those in birth control pills. It must be changed once a week and is prescribed by a health care provider.  Vaginal ring The ring contains hormones similar to those in birth control pills. It is left in the vagina for 3 weeks, removed for 1 week, and then a new one is put back in place. The patient must be comfortable inserting and removing the ring from the vagina.A health care provider's prescription is necessary.  Emergency contraception Emergency contraceptives prevent pregnancy after unprotected sexual intercourse. This pill can be taken right after sex or up to 5 days after unprotected sex. It is most effective  the sooner you take the pills after having sexual intercourse. Most emergency contraceptive pills are available without a prescription. Check with your pharmacist. Do not use emergency contraception as your only form of birth control. BARRIER METHODS   Female condom This is a thin sheath (latex or rubber) that is worn over the penis during sexual intercourse. It can be used with spermicide to increase effectiveness.  Female condom. This is a soft, loose-fitting sheath that is put into the vagina before sexual intercourse.  Diaphragm This is a soft, latex, dome-shaped barrier that must be fitted by a health care provider. It is inserted into the vagina, along with a spermicidal jelly. It is inserted before intercourse. The diaphragm should be left in the vagina for 6 to 8 hours after intercourse.  Cervical cap This is a round, soft, latex or plastic cup that fits over the cervix and must be fitted by a health care provider. The cap can be left in place for up to 48 hours after intercourse.  Sponge This is a soft, circular piece of polyurethane foam. The sponge has spermicide in it. It is inserted into the vagina after wetting it and before sexual intercourse.  Spermicides These are chemicals that kill or block sperm from entering the cervix and uterus. They come in the form of creams, jellies, suppositories, foam, or tablets. They do not require a prescription. They are inserted into the vagina with an applicator before having sexual intercourse. The process must be repeated every time you have sexual intercourse. INTRAUTERINE CONTRACEPTION  Intrauterine device (IUD) This is a T-shaped device that is put in a woman's uterus during a menstrual period to prevent pregnancy. There are 2 types:  Copper IUD This type of IUD is wrapped in copper wire and is placed inside the uterus. Copper makes the uterus and fallopian tubes produce a fluid that kills sperm. It can stay in place for 10 years.  Hormone IUD  This type of IUD contains the hormone progestin (synthetic progesterone). The hormone thickens the cervical mucus and prevents sperm from entering the uterus, and it also thins the uterine lining to prevent implantation of a fertilized egg. The hormone can weaken or kill the sperm that get into the uterus. It can stay in place for 3 5 years, depending on which type of  IUD is used. PERMANENT METHODS OF CONTRACEPTION  Female tubal ligation This is when the woman's fallopian tubes are surgically sealed, tied, or blocked to prevent the egg from traveling to the uterus.  Hysteroscopic sterilization This involves placing a small coil or insert into each fallopian tube. Your doctor uses a technique called hysteroscopy to do the procedure. The device causes scar tissue to form. This results in permanent blockage of the fallopian tubes, so the sperm cannot fertilize the egg. It takes about 3 months after the procedure for the tubes to become blocked. You must use another form of birth control for these 3 months.  Female sterilization This is when the female has the tubes that carry sperm tied off (vasectomy).This blocks sperm from entering the vagina during sexual intercourse. After the procedure, the man can still ejaculate fluid (semen). NATURAL PLANNING METHODS  Natural family planning This is not having sexual intercourse or using a barrier method (condom, diaphragm, cervical cap) on days the woman could become pregnant.  Calendar method This is keeping track of the length of each menstrual cycle and identifying when you are fertile.  Ovulation method This is avoiding sexual intercourse during ovulation.  Symptothermal method This is avoiding sexual intercourse during ovulation, using a thermometer and ovulation symptoms.  Post ovulation method This is timing sexual intercourse after you have ovulated. Regardless of which type or method of contraception you choose, it is important that you use condoms to  protect against the transmission of sexually transmitted infections (STIs). Talk with your health care provider about which form of contraception is most appropriate for you. Document Released: 10/20/2005 Document Revised: 06/22/2013 Document Reviewed: 04/14/2013 [facility] Patient Information 2014 [place].  Breastfeeding Deciding to breastfeed is one of the best choices you can make for you and your baby. A change in hormones during pregnancy causes your breast tissue to grow and increases the number and size of your milk ducts. These hormones also allow proteins, sugars, and fats from your blood supply to make breast milk in your milk-producing glands. Hormones prevent breast milk from being released before your baby is born as well as prompt milk flow after birth. Once breastfeeding has begun, thoughts of your baby, as well as his or her sucking or crying, can stimulate the release of milk from your milk-producing glands.  BENEFITS OF BREASTFEEDING For Your Baby  Your first milk (colostrum) helps your baby's digestive system function better.   There are antibodies in your milk that help your baby fight off infections.   Your baby has a lower incidence of asthma, allergies, and sudden infant death syndrome.   The nutrients in breast milk are better for your baby than infant formulas and are designed uniquely for your baby's needs.   Breast milk improves your baby's brain development.   Your baby is less likely to develop other conditions, such as childhood obesity, asthma, or type 2 diabetes mellitus.  For You   Breastfeeding helps to create a very special bond between you and your baby.   Breastfeeding is convenient. Breast milk is always available at the correct temperature and costs nothing.   Breastfeeding helps to burn calories and helps you lose the weight gained during pregnancy.   Breastfeeding makes your uterus contract to its prepregnancy size faster and slows  bleeding (lochia) after you give birth.   Breastfeeding helps to lower your risk of developing type 2 diabetes mellitus, osteoporosis, and breast or ovarian cancer later in life. SIGNS THAT YOUR  BABY IS HUNGRY Early Signs of Hunger  Increased alertness or activity.  Stretching.  Movement of the head from side to side.  Movement of the head and opening of the mouth when the corner of the mouth or cheek is stroked (rooting).  Increased sucking sounds, smacking lips, cooing, sighing, or squeaking.  Hand-to-mouth movements.  Increased sucking of fingers or hands. Late Signs of Hunger  Fussing.  Intermittent crying. Extreme Signs of Hunger Signs of extreme hunger will require calming and consoling before your baby will be able to breastfeed successfully. Do not wait for the following signs of extreme hunger to occur before you initiate breastfeeding:   Restlessness.  A loud, strong cry.   Screaming. BREASTFEEDING BASICS Breastfeeding Initiation  Find a comfortable place to sit or lie down, with your neck and back well supported.  Place a pillow or rolled up blanket under your baby to bring him or her to the level of your breast (if you are seated). Nursing pillows are specially designed to help support your arms and your baby while you breastfeed.  Make sure that your baby's abdomen is facing your abdomen.   Gently massage your breast. With your fingertips, massage from your chest wall toward your nipple in a circular motion. This encourages milk flow. You may need to continue this action during the feeding if your milk flows slowly.  Support your breast with 4 fingers underneath and your thumb above your nipple. Make sure your fingers are well away from your nipple and your baby's mouth.   Stroke your baby's lips gently with your finger or nipple.   When your baby's mouth is open wide enough, quickly bring your baby to your breast, placing your entire nipple and as  much of the colored area around your nipple (areola) as possible into your baby's mouth.   More areola should be visible above your baby's upper lip than below the lower lip.   Your baby's tongue should be between his or her lower gum and your breast.   Ensure that your baby's mouth is correctly positioned around your nipple (latched). Your baby's lips should create a seal on your breast and be turned out (everted).  It is common for your baby to suck about 2 3 minutes in order to start the flow of breast milk. Latching Teaching your baby how to latch on to your breast properly is very important. An improper latch can cause nipple pain and decreased milk supply for you and poor weight gain in your baby. Also, if your baby is not latched onto your nipple properly, he or she may swallow some air during feeding. This can make your baby fussy. Burping your baby when you switch breasts during the feeding can help to get rid of the air. However, teaching your baby to latch on properly is still the best way to prevent fussiness from swallowing air while breastfeeding. Signs that your baby has successfully latched on to your nipple:    Silent tugging or silent sucking, without causing you pain.   Swallowing heard between every 3 4 sucks.    Muscle movement above and in front of his or her ears while sucking.  Signs that your baby has not successfully latched on to nipple:   Sucking sounds or smacking sounds from your baby while breastfeeding.  Nipple pain. If you think your baby has not latched on correctly, slip your finger into the corner of your baby's mouth to break the suction and place  it between your baby's gums. Attempt breastfeeding initiation again. Signs of Successful Breastfeeding Signs from your baby:   A gradual decrease in the number of sucks or complete cessation of sucking.   Falling asleep.   Relaxation of his or her body.   Retention of a small amount of milk in  his or her mouth.   Letting go of your breast by himself or herself. Signs from you:  Breasts that have increased in firmness, weight, and size 1 3 hours after feeding.   Breasts that are softer immediately after breastfeeding.  Increased milk volume, as well as a change in milk consistency and color by the 5th day of breastfeeding.   Nipples that are not sore, cracked, or bleeding. Signs That Your Baby is Getting Enough Milk  Wetting at least 3 diapers in a 24-hour period. The urine should be clear and pale yellow by age 5 days.  At least 3 stools in a 24-hour period by age 5 days. The stool should be soft and yellow.  At least 3 stools in a 24-hour period by age 7 days. The stool should be seedy and yellow.  No loss of weight greater than 10% of birth weight during the first 3 days of age.  Average weight gain of 4 7 ounces (120 210 mL) per week after age 4 days.  Consistent daily weight gain by age 5 days, without weight loss after the age of 2 weeks. After a feeding, your baby may spit up a small amount. This is common. BREASTFEEDING FREQUENCY AND DURATION Frequent feeding will help you make more milk and can prevent sore nipples and breast engorgement. Breastfeed when you feel the need to reduce the fullness of your breasts or when your baby shows signs of hunger. This is called "breastfeeding on demand." Avoid introducing a pacifier to your baby while you are working to establish breastfeeding (the first 4 6 weeks after your baby is born). After this time you may choose to use a pacifier. Research has shown that pacifier use during the first year of a baby's life decreases the risk of sudden infant death syndrome (SIDS). Allow your baby to feed on each breast as long as he or she wants. Breastfeed until your baby is finished feeding. When your baby unlatches or falls asleep while feeding from the first breast, offer the second breast. Because newborns are often sleepy in the  first few weeks of life, you may need to awaken your baby to get him or her to feed. Breastfeeding times will vary from baby to baby. However, the following rules can serve as a guide to help you ensure that your baby is properly fed:  Newborns (babies 4 weeks of age or younger) may breastfeed every 1 3 hours.  Newborns should not go longer than 3 hours during the day or 5 hours during the night without breastfeeding.  You should breastfeed your baby a minimum of 8 times in a 24-hour period until you begin to introduce solid foods to your baby at around 6 months of age. BREAST MILK PUMPING Pumping and storing breast milk allows you to ensure that your baby is exclusively fed your breast milk, even at times when you are unable to breastfeed. This is especially important if you are going back to work while you are still breastfeeding or when you are not able to be present during feedings. Your lactation consultant can give you guidelines on how long it is safe to store breast   milk.  A breast pump is a machine that allows you to pump milk from your breast into a sterile bottle. The pumped breast milk can then be stored in a refrigerator or freezer. Some breast pumps are operated by hand, while others use electricity. Ask your lactation consultant which type will work best for you. Breast pumps can be purchased, but some hospitals and breastfeeding support groups lease breast pumps on a monthly basis. A lactation consultant can teach you how to hand express breast milk, if you prefer not to use a pump.  CARING FOR YOUR BREASTS WHILE YOU BREASTFEED Nipples can become dry, cracked, and sore while breastfeeding. The following recommendations can help keep your breasts moisturized and healthy:  Avoid using soap on your nipples.   Wear a supportive bra. Although not required, special nursing bras and tank tops are designed to allow access to your breasts for breastfeeding without taking off your entire bra  or top. Avoid wearing underwire style bras or extremely tight bras.  Air dry your nipples for 3 84minutes after each feeding.   Use only cotton bra pads to absorb leaked breast milk. Leaking of breast milk between feedings is normal.   Use lanolin on your nipples after breastfeeding. Lanolin helps to maintain your skin's normal moisture barrier. If you use pure lanolin you do not need to wash it off before feeding your baby again. Pure lanolin is not toxic to your baby. You may also hand express a few drops of breast milk and gently massage that milk into your nipples and allow the milk to air dry. In the first few weeks after giving birth, some women experience extremely full breasts (engorgement). Engorgement can make your breasts feel heavy, warm, and tender to the touch. Engorgement peaks within 3 5 days after you give birth. The following recommendations can help ease engorgement:  Completely empty your breasts while breastfeeding or pumping. You may want to start by applying warm, moist heat (in the shower or with warm water-soaked hand towels) just before feeding or pumping. This increases circulation and helps the milk flow. If your baby does not completely empty your breasts while breastfeeding, pump any extra milk after he or she is finished.  Wear a snug bra (nursing or regular) or tank top for 1 2 days to signal your body to slightly decrease milk production.  Apply ice packs to your breasts, unless this is too uncomfortable for you.  Make sure that your baby is latched on and positioned properly while breastfeeding. If engorgement persists after 48 hours of following these recommendations, contact your health care provider or a Science writer. OVERALL HEALTH CARE RECOMMENDATIONS WHILE BREASTFEEDING  Eat healthy foods. Alternate between meals and snacks, eating 3 of each per day. Because what you eat affects your breast milk, some of the foods may make your baby more irritable  than usual. Avoid eating these foods if you are sure that they are negatively affecting your baby.  Drink milk, fruit juice, and water to satisfy your thirst (about 10 glasses a day).   Rest often, relax, and continue to take your prenatal vitamins to prevent fatigue, stress, and anemia.  Continue breast self-awareness checks.  Avoid chewing and smoking tobacco.  Avoid alcohol and drug use. Some medicines that may be harmful to your baby can pass through breast milk. It is important to ask your health care provider before taking any medicine, including all over-the-counter and prescription medicine as well as vitamin and herbal supplements.  It is possible to become pregnant while breastfeeding. If birth control is desired, ask your health care provider about options that will be safe for your baby. SEEK MEDICAL CARE IF:   You feel like you want to stop breastfeeding or have become frustrated with breastfeeding.  You have painful breasts or nipples.  Your nipples are cracked or bleeding.  Your breasts are red, tender, or warm.  You have a swollen area on either breast.  You have a fever or chills.  You have nausea or vomiting.  You have drainage other than breast milk from your nipples.  Your breasts do not become full before feedings by the 5th day after you give birth.  You feel sad and depressed.  Your baby is too sleepy to eat well.  Your baby is having trouble sleeping.   Your baby is wetting less than 3 diapers in a 24-hour period.  Your baby has less than 3 stools in a 24-hour period.  Your baby's skin or the white part of his or her eyes becomes yellow.   Your baby is not gaining weight by 6 days of age. SEEK IMMEDIATE MEDICAL CARE IF:   Your baby is overly tired (lethargic) and does not want to wake up and feed.  Your baby develops an unexplained fever. Document Released: 10/20/2005 Document Revised: 06/22/2013 Document Reviewed: 04/13/2013 [facility]  Patient Information 2014 [place].

## 2013-11-24 NOTE — Progress Notes (Signed)
   Subjective:    Rachel Jackson is a G2P1001 Unknown being seen today for her first obstetrical visit.  Her obstetrical history is significant for recent miscarriage in November 2014. Patient does intend to breast feed. Pregnancy history fully reviewed.  Patient reports no complaints.  There were no vitals filed for this visit.  HISTORY: OB History  Gravida Para Term Preterm AB SAB TAB Ectopic Multiple Living  2 1 1       1     # Outcome Date GA Lbr Len/2nd Weight Sex Delivery Anes PTL Lv  2 TRM           1 GRA              Comments: System Generated. Please review and update pregnancy details.     Past Medical History  Diagnosis Date  . Medical history non-contributory   . Gluten intolerance   . Lactose intolerance    Past Surgical History  Procedure Laterality Date  . No past surgeries     Family History  Problem Relation Age of Onset  . Hypertension Father      Exam    Uterus:     Pelvic Exam:    Perineum: Normal Perineum   Vulva: normal   Vagina:  normal mucosa, normal discharge   pH:    Cervix: closed and long   Adnexa: normal adnexa and no mass, fullness, tenderness   Bony Pelvis: android  System: Breast:  normal appearance, no masses or tenderness   Skin: normal coloration and turgor, no rashes    Neurologic: oriented, no focal deficits   Extremities: normal strength, tone, and muscle mass   HEENT extra ocular movement intact   Mouth/Teeth mucous membranes moist, pharynx normal without lesions   Neck supple and no masses   Cardiovascular: regular rate and rhythm   Respiratory:  chest clear, no wheezing, crepitations, rhonchi, normal symmetric air entry   Abdomen: soft, non-tender; bowel sounds normal; no masses,  no organomegaly   Urinary:       Assessment:    Pregnancy: G2P1001 Patient Active Problem List   Diagnosis Date Noted  . Atypical squamous cells of undetermined significance (ASCUS) on Papanicolaou smear of cervix 11/24/2013  .  Supervision of other normal pregnancy 11/24/2013        Plan:     Initial labs drawn. Prenatal vitamins. Problem list reviewed and updated. Genetic Screening discussed First Screen: declined.  Ultrasound discussed; fetal survey: will be ordered at a susequent visit.  Follow up in 4 weeks. [facility] 50% of 30 min visit spent on counseling and coordination of care.     Amayrani Bennick 11/24/2013

## 2013-11-24 NOTE — Progress Notes (Signed)
P-90 

## 2013-11-25 LAB — OBSTETRIC PANEL
Antibody Screen: NEGATIVE
Basophils Absolute: 0 10*3/uL (ref 0.0–0.1)
Basophils Relative: 0 % (ref 0–1)
EOS ABS: 0.2 10*3/uL (ref 0.0–0.7)
Eosinophils Relative: 2 % (ref 0–5)
HCT: 37.1 % (ref 36.0–46.0)
HEMOGLOBIN: 12.7 g/dL (ref 12.0–15.0)
HEP B S AG: NEGATIVE
LYMPHS ABS: 2.2 10*3/uL (ref 0.7–4.0)
Lymphocytes Relative: 24 % (ref 12–46)
MCH: 28.7 pg (ref 26.0–34.0)
MCHC: 34.2 g/dL (ref 30.0–36.0)
MCV: 83.9 fL (ref 78.0–100.0)
MONOS PCT: 7 % (ref 3–12)
Monocytes Absolute: 0.7 10*3/uL (ref 0.1–1.0)
NEUTROS ABS: 6.4 10*3/uL (ref 1.7–7.7)
Neutrophils Relative %: 67 % (ref 43–77)
Platelets: 210 10*3/uL (ref 150–400)
RBC: 4.42 MIL/uL (ref 3.87–5.11)
RDW: 15.2 % (ref 11.5–15.5)
RH TYPE: POSITIVE
Rubella: 1.6 Index — ABNORMAL HIGH (ref <0.90)
WBC: 9.5 10*3/uL (ref 4.0–10.5)

## 2013-11-25 LAB — HIV ANTIBODY (ROUTINE TESTING W REFLEX): HIV: NONREACTIVE

## 2013-11-26 LAB — CULTURE, OB URINE
Colony Count: NO GROWTH
Organism ID, Bacteria: NO GROWTH

## 2013-12-01 ENCOUNTER — Ambulatory Visit (INDEPENDENT_AMBULATORY_CARE_PROVIDER_SITE_OTHER): Payer: Medicaid Other | Admitting: Obstetrics & Gynecology

## 2013-12-01 ENCOUNTER — Encounter: Payer: Self-pay | Admitting: Obstetrics & Gynecology

## 2013-12-01 VITALS — BP 115/71 | HR 75 | Ht 64.0 in | Wt 150.8 lb

## 2013-12-01 DIAGNOSIS — R87610 Atypical squamous cells of undetermined significance on cytologic smear of cervix (ASC-US): Secondary | ICD-10-CM

## 2013-12-01 NOTE — Progress Notes (Signed)
    GYNECOLOGY CLINIC PROCEDURE NOTE  28 y.o. G3P1011 at 2763w4d here for colposcopy for ASCUS with POSITIVE high risk HPV pap smear on 09/20/13. Discussed role for HPV in cervical dysplasia, need for surveillance.  Patient given informed consent, signed copy in the chart, time out was performed. FHR for doppler was 155.  Placed in lithotomy position. Cervix viewed with speculum and colposcope after application of acetic acid.   Colposcopy adequate? Yes No visible lesions; no biopsy obtained  Will follow up with postpartum pap smear.  [facility]   Rachel CollinsUGONNA  Geovani Tootle, MD, FACOG Attending Obstetrician & Gynecologist Faculty Practice, [facility]

## 2013-12-01 NOTE — Patient Instructions (Signed)
Colposcopy, Care After  Refer to this sheet in the next few weeks. These instructions provide you with information on caring for yourself after your procedure. Your health care provider may also give you more specific instructions. Your treatment has been planned according to current medical practices, but problems sometimes occur. Call your health care provider if you have any problems or questions after your procedure.  WHAT TO EXPECT AFTER THE PROCEDURE   After your procedure, it is typical to have the following:  · Cramping. This often goes away in a few minutes.  · Soreness. This may last for 2 days.  · Lightheadedness. Lie down for a few minutes if this occurs.  You may also have some bleeding or dark discharge for a few days. You may need to wear a sanitary pad during this time.  HOME CARE INSTRUCTIONS  · Avoid sex, douching, and using tampons for 3 days or as directed by your health care provider.  · Only take over-the-counter or prescription medicines as directed by your health care provider. Do not take aspirin because it can cause bleeding.  · Continue to take birth control pills if you are on them.  · Not all test results are available during your visit. If your test results are not back during the visit, make an appointment with your health care provider to find out the results. Do not assume everything is normal if you have not heard from your health care provider or the medical facility. It is important for you to follow up on all of your test results.  · Follow your health care provider's advice regarding activity, follow-up visits, and follow-up Pap tests.  SEEK MEDICAL CARE IF:  · You develop a rash.  · You have problems with your medicine.  SEEK IMMEDIATE MEDICAL CARE IF:  · You are bleeding heavily or are passing blood clots.  · You have a fever.  · You have abnormal vaginal discharge.  · You are having cramps that do not go away after taking your pain medicine.  · You feel lightheaded, dizzy, or  faint.  · You have stomach pain.  Document Released: 08/10/2013 Document Reviewed: 05/19/2013  [employer]® Patient Information ©2014 [employer].

## 2013-12-05 ENCOUNTER — Encounter: Payer: Self-pay | Admitting: *Deleted

## 2013-12-22 ENCOUNTER — Encounter: Payer: Self-pay | Admitting: Obstetrics and Gynecology

## 2013-12-22 ENCOUNTER — Ambulatory Visit (INDEPENDENT_AMBULATORY_CARE_PROVIDER_SITE_OTHER): Payer: Medicaid Other | Admitting: Obstetrics and Gynecology

## 2013-12-22 VITALS — BP 124/68 | Wt 154.0 lb

## 2013-12-22 DIAGNOSIS — Z3480 Encounter for supervision of other normal pregnancy, unspecified trimester: Secondary | ICD-10-CM

## 2013-12-22 DIAGNOSIS — R87610 Atypical squamous cells of undetermined significance on cytologic smear of cervix (ASC-US): Secondary | ICD-10-CM

## 2013-12-22 NOTE — Progress Notes (Signed)
P-89 

## 2013-12-22 NOTE — Progress Notes (Signed)
Patient is doing well without complaints. Anatomy ultrasound ordered

## 2014-01-06 ENCOUNTER — Ambulatory Visit (HOSPITAL_COMMUNITY)
Admission: RE | Admit: 2014-01-06 | Discharge: 2014-01-06 | Disposition: A | Discharge status: Home / Self Care | Payer: Medicaid Other | Source: Ambulatory Visit | Attending: Obstetrics and Gynecology | Admitting: Obstetrics and Gynecology

## 2014-01-06 DIAGNOSIS — Z3480 Encounter for supervision of other normal pregnancy, unspecified trimester: Secondary | ICD-10-CM

## 2014-01-06 DIAGNOSIS — Z3689 Encounter for other specified antenatal screening: Secondary | ICD-10-CM | POA: Insufficient documentation

## 2014-01-09 ENCOUNTER — Encounter: Payer: Self-pay | Admitting: Obstetrics and Gynecology

## 2014-01-20 ENCOUNTER — Ambulatory Visit (INDEPENDENT_AMBULATORY_CARE_PROVIDER_SITE_OTHER): Payer: Medicaid Other | Admitting: Family Medicine

## 2014-01-20 VITALS — BP 115/72 | Wt 158.0 lb

## 2014-01-20 DIAGNOSIS — Z3480 Encounter for supervision of other normal pregnancy, unspecified trimester: Secondary | ICD-10-CM

## 2014-01-20 NOTE — Progress Notes (Signed)
P - 97 

## 2014-01-20 NOTE — Progress Notes (Signed)
Doing well 

## 2014-01-20 NOTE — Patient Instructions (Signed)
Second Trimester of Pregnancy The second trimester is from week 13 through week 28, months 4 through 6. The second trimester is often a time when you feel your best. Your body has also adjusted to being pregnant, and you begin to feel better physically. Usually, morning sickness has lessened or quit completely, you may have more energy, and you may have an increase in appetite. The second trimester is also a time when the fetus is growing rapidly. At the end of the sixth month, the fetus is about 9 inches long and weighs about 1 pounds. You will likely begin to feel the baby move (quickening) between 18 and 20 weeks of the pregnancy. BODY CHANGES Your body goes through many changes during pregnancy. The changes vary from woman to woman.   Your weight will continue to increase. You will notice your lower abdomen bulging out.  You may begin to get stretch marks on your hips, abdomen, and breasts.  You may develop headaches that can be relieved by medicines approved by your caregiver.  You may urinate more often because the fetus is pressing on your bladder.  You may develop or continue to have heartburn as a result of your pregnancy.  You may develop constipation because certain hormones are causing the muscles that push waste through your intestines to slow down.  You may develop hemorrhoids or swollen, bulging veins (varicose veins).  You may have back pain because of the weight gain and pregnancy hormones relaxing your joints between the bones in your pelvis and as a result of a shift in weight and the muscles that support your balance.  Your breasts will continue to grow and be tender.  Your gums may bleed and may be sensitive to brushing and flossing.  Dark spots or blotches (chloasma, mask of pregnancy) may develop on your face. This will likely fade after the baby is born.  A dark line from your belly button to the pubic area (linea nigra) may appear. This will likely fade after  the baby is born. WHAT TO EXPECT AT YOUR PRENATAL VISITS During a routine prenatal visit:  You will be weighed to make sure you and the fetus are growing normally.  Your blood pressure will be taken.  Your abdomen will be measured to track your baby's growth.  The fetal heartbeat will be listened to.  Any test results from the previous visit will be discussed. Your caregiver may ask you:  How you are feeling.  If you are feeling the baby move.  If you have had any abnormal symptoms, such as leaking fluid, bleeding, severe headaches, or abdominal cramping.  If you have any questions. Other tests that may be performed during your second trimester include:  Blood tests that check for:  Low iron levels (anemia).  Gestational diabetes (between 24 and 28 weeks).  Rh antibodies.  Urine tests to check for infections, diabetes, or protein in the urine.  An ultrasound to confirm the proper growth and development of the baby.  An amniocentesis to check for possible genetic problems.  Fetal screens for spina bifida and Down syndrome. HOME CARE INSTRUCTIONS   Avoid all smoking, herbs, alcohol, and unprescribed drugs. These chemicals affect the formation and growth of the baby.  Follow your caregiver's instructions regarding medicine use. There are medicines that are either safe or unsafe to take during pregnancy.  Exercise only as directed by your caregiver. Experiencing uterine cramps is a good sign to stop exercising.  Continue to eat regular,   healthy meals.  Wear a good support bra for breast tenderness.  Do not use hot tubs, steam rooms, or saunas.  Wear your seat belt at all times when driving.  Avoid raw meat, uncooked cheese, cat litter boxes, and soil used by cats. These carry germs that can cause birth defects in the baby.  Take your prenatal vitamins.  Try taking a stool softener (if your caregiver approves) if you develop constipation. Eat more high-fiber  foods, such as fresh vegetables or fruit and whole grains. Drink plenty of fluids to keep your urine clear or pale yellow.  Take warm sitz baths to soothe any pain or discomfort caused by hemorrhoids. Use hemorrhoid cream if your caregiver approves.  If you develop varicose veins, wear support hose. Elevate your feet for 15 minutes, 3 4 times a day. Limit salt in your diet.  Avoid heavy lifting, wear low heel shoes, and practice good posture.  Rest with your legs elevated if you have leg cramps or low back pain.  Visit your dentist if you have not gone yet during your pregnancy. Use a soft toothbrush to brush your teeth and be gentle when you floss.  A sexual relationship may be continued unless your caregiver directs you otherwise.  Continue to go to all your prenatal visits as directed by your caregiver. SEEK MEDICAL CARE IF:   You have dizziness.  You have mild pelvic cramps, pelvic pressure, or nagging pain in the abdominal area.  You have persistent nausea, vomiting, or diarrhea.  You have a bad smelling vaginal discharge.  You have pain with urination. SEEK IMMEDIATE MEDICAL CARE IF:   You have a fever.  You are leaking fluid from your vagina.  You have spotting or bleeding from your vagina.  You have severe abdominal cramping or pain.  You have rapid weight gain or loss.  You have shortness of breath with chest pain.  You notice sudden or extreme swelling of your face, hands, ankles, feet, or legs.  You have not felt your baby move in over an hour.  You have severe headaches that do not go away with medicine.  You have vision changes. Document Released: 10/14/2001 Document Revised: 06/22/2013 Document Reviewed: 12/21/2012 [employer] Patient Information 2014 [employer].  Breastfeeding Deciding to breastfeed is one of the best choices you can make for you and your baby. A change in hormones during pregnancy causes your breast tissue to grow and increases the  number and size of your milk ducts. These hormones also allow proteins, sugars, and fats from your blood supply to make breast milk in your milk-producing glands. Hormones prevent breast milk from being released before your baby is born as well as prompt milk flow after birth. Once breastfeeding has begun, thoughts of your baby, as well as his or her sucking or crying, can stimulate the release of milk from your milk-producing glands.  BENEFITS OF BREASTFEEDING For Your Baby  Your first milk (colostrum) helps your baby's digestive system function better.   There are antibodies in your milk that help your baby fight off infections.   Your baby has a lower incidence of asthma, allergies, and sudden infant death syndrome.   The nutrients in breast milk are better for your baby than infant formulas and are designed uniquely for your baby's needs.   Breast milk improves your baby's brain development.   Your baby is less likely to develop other conditions, such as childhood obesity, asthma, or type 2 diabetes mellitus.  For   You   Breastfeeding helps to create a very special bond between you and your baby.   Breastfeeding is convenient. Breast milk is always available at the correct temperature and costs nothing.   Breastfeeding helps to burn calories and helps you lose the weight gained during pregnancy.   Breastfeeding makes your uterus contract to its prepregnancy size faster and slows bleeding (lochia) after you give birth.   Breastfeeding helps to lower your risk of developing type 2 diabetes mellitus, osteoporosis, and breast or ovarian cancer later in life. SIGNS THAT YOUR BABY IS HUNGRY Early Signs of Hunger  Increased alertness or activity.  Stretching.  Movement of the head from side to side.  Movement of the head and opening of the mouth when the corner of the mouth or cheek is stroked (rooting).  Increased sucking sounds, smacking lips, cooing, sighing, or  squeaking.  Hand-to-mouth movements.  Increased sucking of fingers or hands. Late Signs of Hunger  Fussing.  Intermittent crying. Extreme Signs of Hunger Signs of extreme hunger will require calming and consoling before your baby will be able to breastfeed successfully. Do not wait for the following signs of extreme hunger to occur before you initiate breastfeeding:   Restlessness.  A loud, strong cry.   Screaming. BREASTFEEDING BASICS Breastfeeding Initiation  Find a comfortable place to sit or lie down, with your neck and back well supported.  Place a pillow or rolled up blanket under your baby to bring him or her to the level of your breast (if you are seated). Nursing pillows are specially designed to help support your arms and your baby while you breastfeed.  Make sure that your baby's abdomen is facing your abdomen.   Gently massage your breast. With your fingertips, massage from your chest wall toward your nipple in a circular motion. This encourages milk flow. You may need to continue this action during the feeding if your milk flows slowly.  Support your breast with 4 fingers underneath and your thumb above your nipple. Make sure your fingers are well away from your nipple and your baby's mouth.   Stroke your baby's lips gently with your finger or nipple.   When your baby's mouth is open wide enough, quickly bring your baby to your breast, placing your entire nipple and as much of the colored area around your nipple (areola) as possible into your baby's mouth.   More areola should be visible above your baby's upper lip than below the lower lip.   Your baby's tongue should be between his or her lower gum and your breast.   Ensure that your baby's mouth is correctly positioned around your nipple (latched). Your baby's lips should create a seal on your breast and be turned out (everted).  It is common for your baby to suck about 2 3 minutes in order to start the  flow of breast milk. Latching Teaching your baby how to latch on to your breast properly is very important. An improper latch can cause nipple pain and decreased milk supply for you and poor weight gain in your baby. Also, if your baby is not latched onto your nipple properly, he or she may swallow some air during feeding. This can make your baby fussy. Burping your baby when you switch breasts during the feeding can help to get rid of the air. However, teaching your baby to latch on properly is still the best way to prevent fussiness from swallowing air while breastfeeding. Signs that your baby has   successfully latched on to your nipple:    Silent tugging or silent sucking, without causing you pain.   Swallowing heard between every 3 4 sucks.    Muscle movement above and in front of his or her ears while sucking.  Signs that your baby has not successfully latched on to nipple:   Sucking sounds or smacking sounds from your baby while breastfeeding.  Nipple pain. If you think your baby has not latched on correctly, slip your finger into the corner of your baby's mouth to break the suction and place it between your baby's gums. Attempt breastfeeding initiation again. Signs of Successful Breastfeeding Signs from your baby:   A gradual decrease in the number of sucks or complete cessation of sucking.   Falling asleep.   Relaxation of his or her body.   Retention of a small amount of milk in his or her mouth.   Letting go of your breast by himself or herself. Signs from you:  Breasts that have increased in firmness, weight, and size 1 3 hours after feeding.   Breasts that are softer immediately after breastfeeding.  Increased milk volume, as well as a change in milk consistency and color by the 5th day of breastfeeding.   Nipples that are not sore, cracked, or bleeding. Signs That Your Baby is Getting Enough Milk  Wetting at least 3 diapers in a 24-hour period. The urine  should be clear and pale yellow by age 5 days.  At least 3 stools in a 24-hour period by age 5 days. The stool should be soft and yellow.  At least 3 stools in a 24-hour period by age 7 days. The stool should be seedy and yellow.  No loss of weight greater than 10% of birth weight during the first 3 days of age.  Average weight gain of 4 7 ounces (120 210 mL) per week after age 4 days.  Consistent daily weight gain by age 5 days, without weight loss after the age of 2 weeks. After a feeding, your baby may spit up a small amount. This is common. BREASTFEEDING FREQUENCY AND DURATION Frequent feeding will help you make more milk and can prevent sore nipples and breast engorgement. Breastfeed when you feel the need to reduce the fullness of your breasts or when your baby shows signs of hunger. This is called "breastfeeding on demand." Avoid introducing a pacifier to your baby while you are working to establish breastfeeding (the first 4 6 weeks after your baby is born). After this time you may choose to use a pacifier. Research has shown that pacifier use during the first year of a baby's life decreases the risk of sudden infant death syndrome (SIDS). Allow your baby to feed on each breast as long as he or she wants. Breastfeed until your baby is finished feeding. When your baby unlatches or falls asleep while feeding from the first breast, offer the second breast. Because newborns are often sleepy in the first few weeks of life, you may need to awaken your baby to get him or her to feed. Breastfeeding times will vary from baby to baby. However, the following rules can serve as a guide to help you ensure that your baby is properly fed:  Newborns (babies 4 weeks of age or younger) may breastfeed every 1 3 hours.  Newborns should not go longer than 3 hours during the day or 5 hours during the night without breastfeeding.  You should breastfeed your baby a minimum of   8 times in a 24-hour period until  you begin to introduce solid foods to your baby at around 6 months of age. BREAST MILK PUMPING Pumping and storing breast milk allows you to ensure that your baby is exclusively fed your breast milk, even at times when you are unable to breastfeed. This is especially important if you are going back to work while you are still breastfeeding or when you are not able to be present during feedings. Your lactation consultant can give you guidelines on how long it is safe to store breast milk.  A breast pump is a machine that allows you to pump milk from your breast into a sterile bottle. The pumped breast milk can then be stored in a refrigerator or freezer. Some breast pumps are operated by hand, while others use electricity. Ask your lactation consultant which type will work best for you. Breast pumps can be purchased, but some hospitals and breastfeeding support groups lease breast pumps on a monthly basis. A lactation consultant can teach you how to hand express breast milk, if you prefer not to use a pump.  CARING FOR YOUR BREASTS WHILE YOU BREASTFEED Nipples can become dry, cracked, and sore while breastfeeding. The following recommendations can help keep your breasts moisturized and healthy:  Avoid using soap on your nipples.   Wear a supportive bra. Although not required, special nursing bras and tank tops are designed to allow access to your breasts for breastfeeding without taking off your entire bra or top. Avoid wearing underwire style bras or extremely tight bras.  Air dry your nipples for 3 4minutes after each feeding.   Use only cotton bra pads to absorb leaked breast milk. Leaking of breast milk between feedings is normal.   Use lanolin on your nipples after breastfeeding. Lanolin helps to maintain your skin's normal moisture barrier. If you use pure lanolin you do not need to wash it off before feeding your baby again. Pure lanolin is not toxic to your baby. You may also hand express a  few drops of breast milk and gently massage that milk into your nipples and allow the milk to air dry. In the first few weeks after giving birth, some women experience extremely full breasts (engorgement). Engorgement can make your breasts feel heavy, warm, and tender to the touch. Engorgement peaks within 3 5 days after you give birth. The following recommendations can help ease engorgement:  Completely empty your breasts while breastfeeding or pumping. You may want to start by applying warm, moist heat (in the shower or with warm water-soaked hand towels) just before feeding or pumping. This increases circulation and helps the milk flow. If your baby does not completely empty your breasts while breastfeeding, pump any extra milk after he or she is finished.  Wear a snug bra (nursing or regular) or tank top for 1 2 days to signal your body to slightly decrease milk production.  Apply ice packs to your breasts, unless this is too uncomfortable for you.  Make sure that your baby is latched on and positioned properly while breastfeeding. If engorgement persists after 48 hours of following these recommendations, contact your health care provider or a lactation consultant. OVERALL HEALTH CARE RECOMMENDATIONS WHILE BREASTFEEDING  Eat healthy foods. Alternate between meals and snacks, eating 3 of each per day. Because what you eat affects your breast milk, some of the foods may make your baby more irritable than usual. Avoid eating these foods if you are sure that they are   negatively affecting your baby.  Drink milk, fruit juice, and water to satisfy your thirst (about 10 glasses a day).   Rest often, relax, and continue to take your prenatal vitamins to prevent fatigue, stress, and anemia.  Continue breast self-awareness checks.  Avoid chewing and smoking tobacco.  Avoid alcohol and drug use. Some medicines that may be harmful to your baby can pass through breast milk. It is important to ask your  health care provider before taking any medicine, including all over-the-counter and prescription medicine as well as vitamin and herbal supplements. It is possible to become pregnant while breastfeeding. If birth control is desired, ask your health care provider about options that will be safe for your baby. SEEK MEDICAL CARE IF:   You feel like you want to stop breastfeeding or have become frustrated with breastfeeding.  You have painful breasts or nipples.  Your nipples are cracked or bleeding.  Your breasts are red, tender, or warm.  You have a swollen area on either breast.  You have a fever or chills.  You have nausea or vomiting.  You have drainage other than breast milk from your nipples.  Your breasts do not become full before feedings by the 5th day after you give birth.  You feel sad and depressed.  Your baby is too sleepy to eat well.  Your baby is having trouble sleeping.   Your baby is wetting less than 3 diapers in a 24-hour period.  Your baby has less than 3 stools in a 24-hour period.  Your baby's skin or the white part of his or her eyes becomes yellow.   Your baby is not gaining weight by 5 days of age. SEEK IMMEDIATE MEDICAL CARE IF:   Your baby is overly tired (lethargic) and does not want to wake up and feed.  Your baby develops an unexplained fever. Document Released: 10/20/2005 Document Revised: 06/22/2013 Document Reviewed: 04/13/2013 [employer] Patient Information 2014 [employer].  

## 2014-02-17 ENCOUNTER — Ambulatory Visit (INDEPENDENT_AMBULATORY_CARE_PROVIDER_SITE_OTHER): Payer: Medicaid Other | Admitting: Obstetrics & Gynecology

## 2014-02-17 ENCOUNTER — Encounter: Payer: Self-pay | Admitting: Obstetrics & Gynecology

## 2014-02-17 VITALS — BP 98/61 | Wt 164.0 lb

## 2014-02-17 DIAGNOSIS — Z3480 Encounter for supervision of other normal pregnancy, unspecified trimester: Secondary | ICD-10-CM

## 2014-02-17 NOTE — Progress Notes (Signed)
[id number]  Doing well no complaints.

## 2014-02-17 NOTE — Progress Notes (Signed)
Routine visit. Good FM. No problems. Excellent weight gain. Glucola, labs, TDAP at next visit.

## 2014-03-10 ENCOUNTER — Ambulatory Visit (INDEPENDENT_AMBULATORY_CARE_PROVIDER_SITE_OTHER): Payer: Medicaid Other | Admitting: Obstetrics & Gynecology

## 2014-03-10 VITALS — BP 113/67 | HR 92 | Wt 166.0 lb

## 2014-03-10 DIAGNOSIS — Z23 Encounter for immunization: Secondary | ICD-10-CM

## 2014-03-10 DIAGNOSIS — Z3480 Encounter for supervision of other normal pregnancy, unspecified trimester: Secondary | ICD-10-CM

## 2014-03-10 LAB — CBC
HCT: 33.9 % — ABNORMAL LOW (ref 36.0–46.0)
HEMOGLOBIN: 11.6 g/dL — AB (ref 12.0–15.0)
MCH: 29.4 pg (ref 26.0–34.0)
MCHC: 34.2 g/dL (ref 30.0–36.0)
MCV: 86 fL (ref 78.0–100.0)
PLATELETS: 144 10*3/uL — AB (ref 150–400)
RBC: 3.94 MIL/uL (ref 3.87–5.11)
RDW: 12.8 % (ref 11.5–15.5)
WBC: 10 10*3/uL (ref 4.0–10.5)

## 2014-03-10 MED ORDER — TETANUS-DIPHTH-ACELL PERTUSSIS 5-2.5-18.5 LF-MCG/0.5 IM SUSP: 0.5000 mL | Freq: Once | INTRAMUSCULAR | Status: DC

## 2014-03-10 NOTE — Progress Notes (Signed)
Third trimester labs and Tdap today.  Will move to [place] around 30 weeks-> [facility].  No other complaints or concerns.  Fetal movement and labor precautions reviewed.

## 2014-03-10 NOTE — Patient Instructions (Signed)
Return to clinic for any obstetric concerns or go to [facility] for evaluation  

## 2014-03-11 LAB — RPR

## 2014-03-11 LAB — GLUCOSE TOLERANCE, 1 HOUR (50G) W/O FASTING: GLUCOSE 1 HOUR GTT: 103 mg/dL (ref 70–140)

## 2014-03-11 LAB — HIV ANTIBODY (ROUTINE TESTING W REFLEX): HIV 1&2 Ab, 4th Generation: NONREACTIVE

## 2014-03-13 ENCOUNTER — Encounter: Payer: Self-pay | Admitting: Obstetrics & Gynecology

## 2014-03-31 ENCOUNTER — Ambulatory Visit (INDEPENDENT_AMBULATORY_CARE_PROVIDER_SITE_OTHER): Payer: Medicaid Other | Admitting: Obstetrics & Gynecology

## 2014-03-31 VITALS — BP 113/73 | HR 107 | Wt 166.0 lb

## 2014-03-31 DIAGNOSIS — Z3480 Encounter for supervision of other normal pregnancy, unspecified trimester: Secondary | ICD-10-CM

## 2014-03-31 NOTE — Progress Notes (Signed)
Routine visit. Good FM. No problems.  

## 2014-04-14 ENCOUNTER — Encounter: Payer: Self-pay | Admitting: Advanced Practice Midwife

## 2014-04-14 ENCOUNTER — Ambulatory Visit (INDEPENDENT_AMBULATORY_CARE_PROVIDER_SITE_OTHER): Payer: Medicaid Other | Admitting: Advanced Practice Midwife

## 2014-04-14 VITALS — BP 106/64 | HR 97 | Wt 169.0 lb

## 2014-04-14 DIAGNOSIS — Z3480 Encounter for supervision of other normal pregnancy, unspecified trimester: Secondary | ICD-10-CM

## 2014-04-14 NOTE — Progress Notes (Signed)
Active baby. Doing well.

## 2014-04-14 NOTE — Patient Instructions (Signed)
Braxton Hicks Contractions Pregnancy is commonly associated with contractions of the uterus throughout the pregnancy. Towards the end of pregnancy (32 to 34 weeks), these contractions [facility](Braxton Willa [facility]icks) can develop more often and may become more forceful. This is not true labor because these contractions do not result in opening (dilatation) and thinning of the cervix. They are sometimes difficult to tell apart from true labor because these contractions can be forceful and people have different pain tolerances. You should not feel embarrassed if you go to the hospital with false labor. Sometimes, the only way to tell if you are in true labor is for your caregiver to follow the changes in the cervix. How to tell the difference between true and false labor:  False labor.  The contractions of false labor are usually shorter, irregular and not as hard as those of true labor.  They are often felt in the front of the lower abdomen and in the groin.  They may leave with walking around or changing positions while lying down.  They get weaker and are shorter lasting as time goes on.  These contractions are usually irregular.  They do not usually become progressively stronger, regular and closer together as with true labor.  True labor.  Contractions in true labor last 30 to 70 seconds, become very regular, usually become more intense, and increase in frequency.  They do not go away with walking.  The discomfort is usually felt in the top of the uterus and spreads to the lower abdomen and low back.  True labor can be determined by your caregiver with an exam. This will show that the cervix is dilating and getting thinner. If there are no prenatal problems or other health problems associated with the pregnancy, it is completely safe to be sent home with false labor and await the onset of true labor. HOME CARE INSTRUCTIONS   Keep up with your usual exercises and instructions.  Take medications as  directed.  Keep your regular prenatal appointment.  Eat and drink lightly if you think you are going into labor.  If BH contractions are making you uncomfortable:  Change your activity position from lying down or resting to walking/walking to resting.  Sit and rest in a tub of warm water.  Drink 2 to 3 glasses of water. Dehydration may cause B-H contractions.  Do slow and deep breathing several times an hour. SEEK IMMEDIATE MEDICAL CARE IF:   Your contractions continue to become stronger, more regular, and closer together.  You have a gushing, burst or leaking of fluid from the vagina.  An oral temperature above 102 F (38.9 C) develops.  You have passage of blood-tinged mucus.  You develop vaginal bleeding.  You develop continuous belly (abdominal) pain.  You have low back pain that you never had before.  You feel the baby's head pushing down causing pelvic pressure.  The baby is not moving as much as it used to. Document Released: 10/20/2005 Document Revised: 01/12/2012 Document Reviewed: 08/01/2013 [facility] Patient Information 2014 [place], [place].  Breastfeeding Deciding to breastfeed is one of the best choices you can make for you and your baby. A change in hormones during pregnancy causes your breast tissue to grow and increases the number and size of your milk ducts. These hormones also allow proteins, sugars, and fats from your blood supply to make breast milk in your milk-producing glands. Hormones prevent breast milk from being released before your baby is born as well as prompt milk flow after  birth. Once breastfeeding has begun, thoughts of your baby, as well as his or her sucking or crying, can stimulate the release of milk from your milk-producing glands.  BENEFITS OF BREASTFEEDING For Your Baby  Your first milk (colostrum) helps your baby's digestive system function better.   There are antibodies in your milk that help your baby fight off infections.    Your baby has a lower incidence of asthma, allergies, and sudden infant death syndrome.   The nutrients in breast milk are better for your baby than infant formulas and are designed uniquely for your baby's needs.   Breast milk improves your baby's brain development.   Your baby is less likely to develop other conditions, such as childhood obesity, asthma, or type 2 diabetes mellitus.  For You   Breastfeeding helps to create a very special bond between you and your baby.   Breastfeeding is convenient. Breast milk is always available at the correct temperature and costs nothing.   Breastfeeding helps to burn calories and helps you lose the weight gained during pregnancy.   Breastfeeding makes your uterus contract to its prepregnancy size faster and slows bleeding (lochia) after you give birth.   Breastfeeding helps to lower your risk of developing type 2 diabetes mellitus, osteoporosis, and breast or ovarian cancer later in life. SIGNS THAT YOUR BABY IS HUNGRY Early Signs of Hunger  Increased alertness or activity.  Stretching.  Movement of the head from side to side.  Movement of the head and opening of the mouth when the corner of the mouth or cheek is stroked (rooting).  Increased sucking sounds, smacking lips, cooing, sighing, or squeaking.  Hand-to-mouth movements.  Increased sucking of fingers or hands. Late Signs of Hunger  Fussing.  Intermittent crying. Extreme Signs of Hunger Signs of extreme hunger will require calming and consoling before your baby will be able to breastfeed successfully. Do not wait for the following signs of extreme hunger to occur before you initiate breastfeeding:   Restlessness.  A loud, strong cry.   Screaming. BREASTFEEDING BASICS Breastfeeding Initiation  Find a comfortable place to sit or lie down, with your neck and back well supported.  Place a pillow or rolled up blanket under your baby to bring him or her to  the level of your breast (if you are seated). Nursing pillows are specially designed to help support your arms and your baby while you breastfeed.  Make sure that your baby's abdomen is facing your abdomen.   Gently massage your breast. With your fingertips, massage from your chest wall toward your nipple in a circular motion. This encourages milk flow. You may need to continue this action during the feeding if your milk flows slowly.  Support your breast with 4 fingers underneath and your thumb above your nipple. Make sure your fingers are well away from your nipple and your baby's mouth.   Stroke your baby's lips gently with your finger or nipple.   When your baby's mouth is open wide enough, quickly bring your baby to your breast, placing your entire nipple and as much of the colored area around your nipple (areola) as possible into your baby's mouth.   More areola should be visible above your baby's upper lip than below the lower lip.   Your baby's tongue should be between his or her lower gum and your breast.   Ensure that your baby's mouth is correctly positioned around your nipple (latched). Your baby's lips should create a seal on your  breast and be turned out (everted).  It is common for your baby to suck about 2 3 minutes in order to start the flow of breast milk. Latching Teaching your baby how to latch on to your breast properly is very important. An improper latch can cause nipple pain and decreased milk supply for you and poor weight gain in your baby. Also, if your baby is not latched onto your nipple properly, he or she may swallow some air during feeding. This can make your baby fussy. Burping your baby when you switch breasts during the feeding can help to get rid of the air. However, teaching your baby to latch on properly is still the best way to prevent fussiness from swallowing air while breastfeeding. Signs that your baby has successfully latched on to your nipple:     Silent tugging or silent sucking, without causing you pain.   Swallowing heard between every 3 4 sucks.    Muscle movement above and in front of his or her ears while sucking.  Signs that your baby has not successfully latched on to nipple:   Sucking sounds or smacking sounds from your baby while breastfeeding.  Nipple pain. If you think your baby has not latched on correctly, slip your finger into the corner of your baby's mouth to break the suction and place it between your baby's gums. Attempt breastfeeding initiation again. Signs of Successful Breastfeeding Signs from your baby:   A gradual decrease in the number of sucks or complete cessation of sucking.   Falling asleep.   Relaxation of his or her body.   Retention of a small amount of milk in his or her mouth.   Letting go of your breast by himself or herself. Signs from you:  Breasts that have increased in firmness, weight, and size 1 3 hours after feeding.   Breasts that are softer immediately after breastfeeding.  Increased milk volume, as well as a change in milk consistency and color by the 5th day of breastfeeding.   Nipples that are not sore, cracked, or bleeding. Signs That Your Pecola LeisureBaby is Getting Enough Milk  Wetting at least 3 diapers in a 24-hour period. The urine should be clear and pale yellow by age 89 days.  At least 3 stools in a 24-hour period by age 89 days. The stool should be soft and yellow.  At least 3 stools in a 24-hour period by age 24 days. The stool should be seedy and yellow.  No loss of weight greater than 10% of birth weight during the first 333 days of age.  Average weight gain of 4 7 ounces (120 210 mL) per week after age 323 days.  Consistent daily weight gain by age 89 days, without weight loss after the age of 2 weeks. After a feeding, your baby may spit up a small amount. This is common. BREASTFEEDING FREQUENCY AND DURATION Frequent feeding will help you make more milk and  can prevent sore nipples and breast engorgement. Breastfeed when you feel the need to reduce the fullness of your breasts or when your baby shows signs of hunger. This is called "breastfeeding on demand." Avoid introducing a pacifier to your baby while you are working to establish breastfeeding (the first 4 6 weeks after your baby is born). After this time you may choose to use a pacifier. Research has shown that pacifier use during the first year of a baby's life decreases the risk of sudden infant death syndrome (SIDS). Allow your  baby to feed on each breast as long as he or she wants. Breastfeed until your baby is finished feeding. When your baby unlatches or falls asleep while feeding from the first breast, offer the second breast. Because newborns are often sleepy in the first few weeks of life, you may need to awaken your baby to get him or her to feed. Breastfeeding times will vary from baby to baby. However, the following rules can serve as a guide to help you ensure that your baby is properly fed:  Newborns (babies 564 weeks of age or younger) may breastfeed every 1 3 hours.  Newborns should not go longer than 3 hours during the day or 5 hours during the night without breastfeeding.  You should breastfeed your baby a minimum of 8 times in a 24-hour period until you begin to introduce solid foods to your baby at around 16 months of age. BREAST MILK PUMPING Pumping and storing breast milk allows you to ensure that your baby is exclusively fed your breast milk, even at times when you are unable to breastfeed. This is especially important if you are going back to work while you are still breastfeeding or when you are not able to be present during feedings. Your lactation consultant can give you guidelines on how long it is safe to store breast milk.  A breast pump is a machine that allows you to pump milk from your breast into a sterile bottle. The pumped breast milk can then be stored in a refrigerator  or freezer. Some breast pumps are operated by hand, while others use electricity. Ask your lactation consultant which type will work best for you. Breast pumps can be purchased, but some hospitals and breastfeeding support groups lease breast pumps on a monthly basis. A lactation consultant can teach you how to hand express breast milk, if you prefer not to use a pump.  CARING FOR YOUR BREASTS WHILE YOU BREASTFEED Nipples can become dry, cracked, and sore while breastfeeding. The following recommendations can help keep your breasts moisturized and healthy:  Avoid using soap on your nipples.   Wear a supportive bra. Although not required, special nursing bras and tank tops are designed to allow access to your breasts for breastfeeding without taking off your entire bra or top. Avoid wearing underwire style bras or extremely tight bras.  Air dry your nipples for 3 4minutes after each feeding.   Use only cotton bra pads to absorb leaked breast milk. Leaking of breast milk between feedings is normal.   Use lanolin on your nipples after breastfeeding. Lanolin helps to maintain your skin's normal moisture barrier. If you use pure lanolin you do not need to wash it off before feeding your baby again. Pure lanolin is not toxic to your baby. You may also hand express a few drops of breast milk and gently massage that milk into your nipples and allow the milk to air dry. In the first few weeks after giving birth, some women experience extremely full breasts (engorgement). Engorgement can make your breasts feel heavy, warm, and tender to the touch. Engorgement peaks within 3 5 days after you give birth. The following recommendations can help ease engorgement:  Completely empty your breasts while breastfeeding or pumping. You may want to start by applying warm, moist heat (in the shower or with warm water-soaked hand towels) just before feeding or pumping. This increases circulation and helps the milk flow.  If your baby does not completely empty your breasts while  breastfeeding, pump any extra milk after he or she is finished.  Wear a snug bra (nursing or regular) or tank top for 1 2 days to signal your body to slightly decrease milk production.  Apply ice packs to your breasts, unless this is too uncomfortable for you.  Make sure that your baby is latched on and positioned properly while breastfeeding. If engorgement persists after 48 hours of following these recommendations, contact your health care provider or a Advertising copywriterlactation consultant. OVERALL HEALTH CARE RECOMMENDATIONS WHILE BREASTFEEDING  Eat healthy foods. Alternate between meals and snacks, eating 3 of each per day. Because what you eat affects your breast milk, some of the foods may make your baby more irritable than usual. Avoid eating these foods if you are sure that they are negatively affecting your baby.  Drink milk, fruit juice, and water to satisfy your thirst (about 10 glasses a day).   Rest often, relax, and continue to take your prenatal vitamins to prevent fatigue, stress, and anemia.  Continue breast self-awareness checks.  Avoid chewing and smoking tobacco.  Avoid alcohol and drug use. Some medicines that may be harmful to your baby can pass through breast milk. It is important to ask your health care provider before taking any medicine, including all over-the-counter and prescription medicine as well as vitamin and herbal supplements. It is possible to become pregnant while breastfeeding. If birth control is desired, ask your health care provider about options that will be safe for your baby. SEEK MEDICAL CARE IF:   You feel like you want to stop breastfeeding or have become frustrated with breastfeeding.  You have painful breasts or nipples.  Your nipples are cracked or bleeding.  Your breasts are red, tender, or warm.  You have a swollen area on either breast.  You have a fever or chills.  You have nausea or  vomiting.  You have drainage other than breast milk from your nipples.  Your breasts do not become full before feedings by the 5th day after you give birth.  You feel sad and depressed.  Your baby is too sleepy to eat well.  Your baby is having trouble sleeping.   Your baby is wetting less than 3 diapers in a 24-hour period.  Your baby has less than 3 stools in a 24-hour period.  Your baby's skin or the white part of his or her eyes becomes yellow.   Your baby is not gaining weight by 725 days of age. SEEK IMMEDIATE MEDICAL CARE IF:   Your baby is overly tired (lethargic) and does not want to wake up and feed.  Your baby develops an unexplained fever. Document Released: 10/20/2005 Document Revised: 06/22/2013 Document Reviewed: 04/13/2013 [facility] Patient Information 2014 [place]Care, [place].

## 2014-04-25 IMAGING — US US OB COMP LESS 14 WK
1 series · 14 of 28 positions shown · non-contrast
Comparison: None.

CLINICAL DATA: Vaginal bleeding

EXAM:
OBSTETRIC <14 WK US AND TRANSVAGINAL OB US
TECHNIQUE: Both transabdominal and transvaginal ultrasound examinations were
performed for complete evaluation of the gestation as well as the
maternal uterus, adnexal regions, and pelvic cul-de-sac.
Transvaginal technique was performed to assess early pregnancy.

[Series 1: us ob comp less 14 wk · 0.22mm/px · 37 acquisitions, 14 frames shown]
[im 2/37]
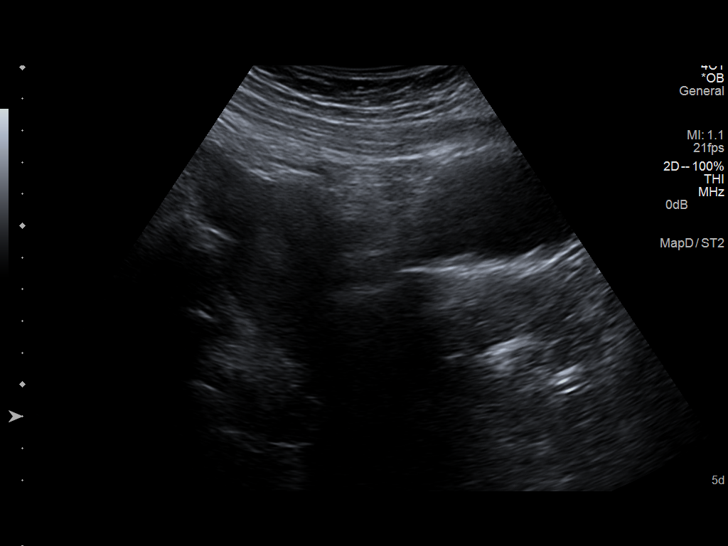
[im 5/37]
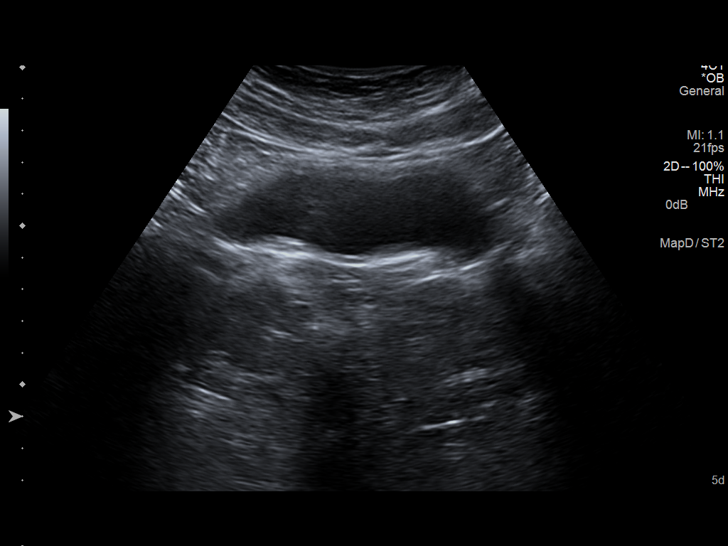
[im 7/37]
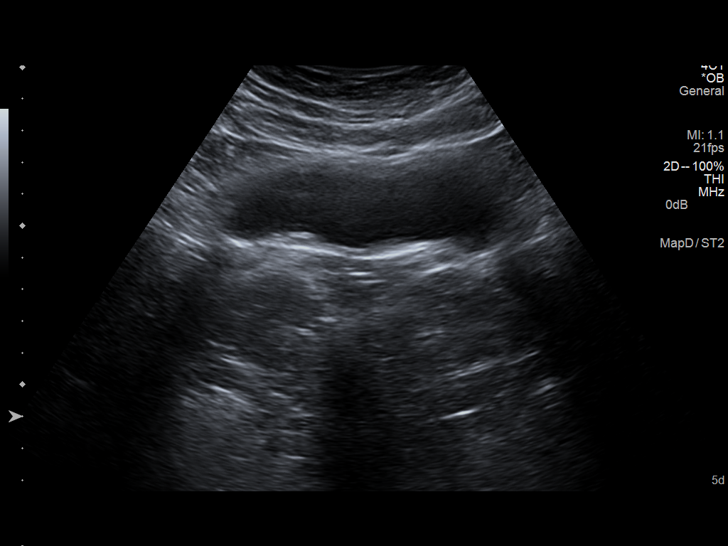
[im 10/37]
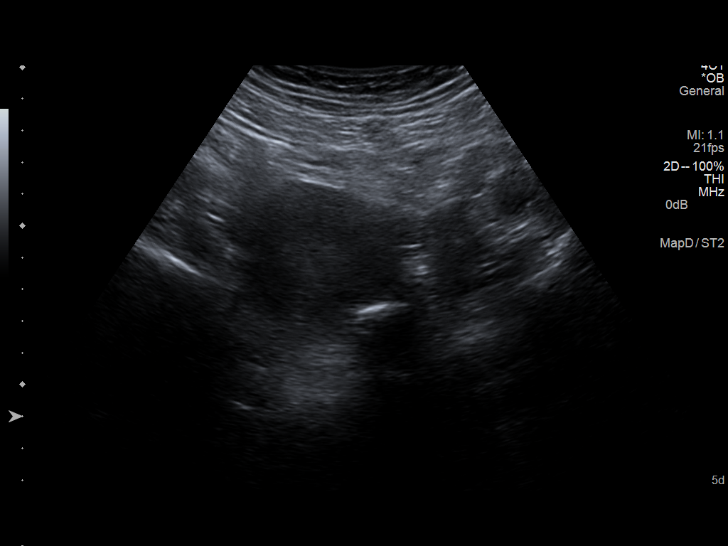
[im 13/37]
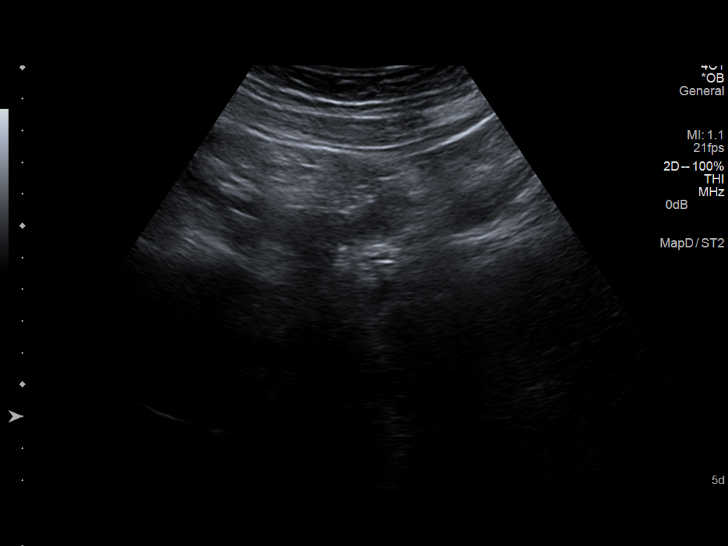
[im 15/37]
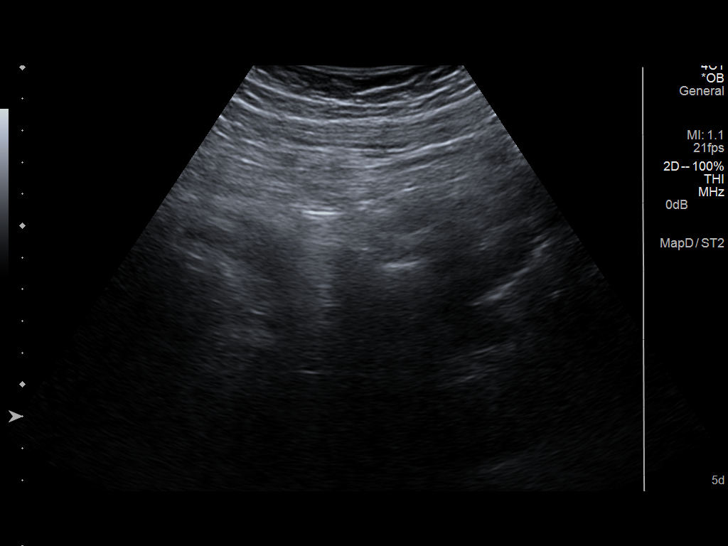
[im 18/37]
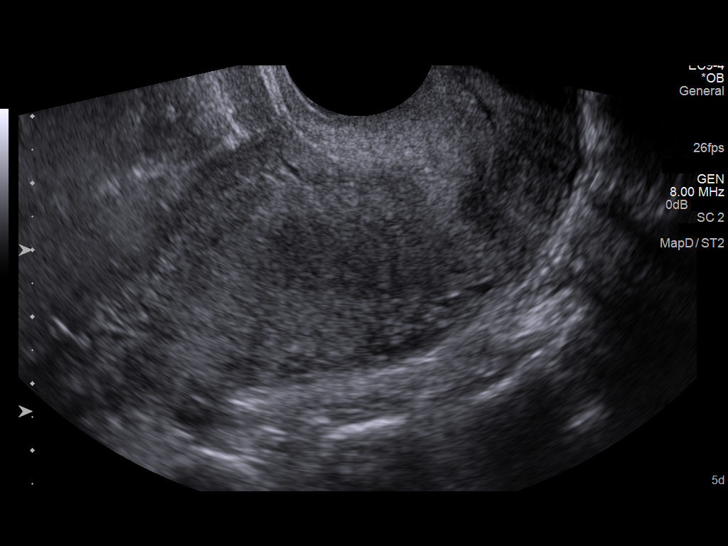
[im 21/37]
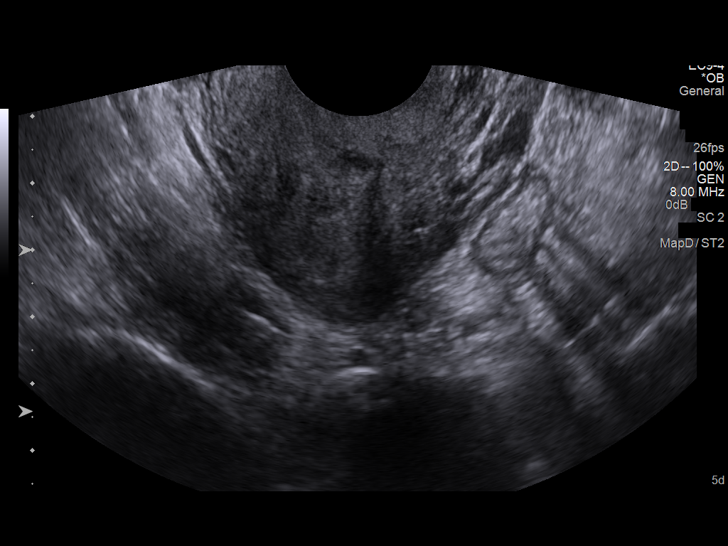
[im 23/37]
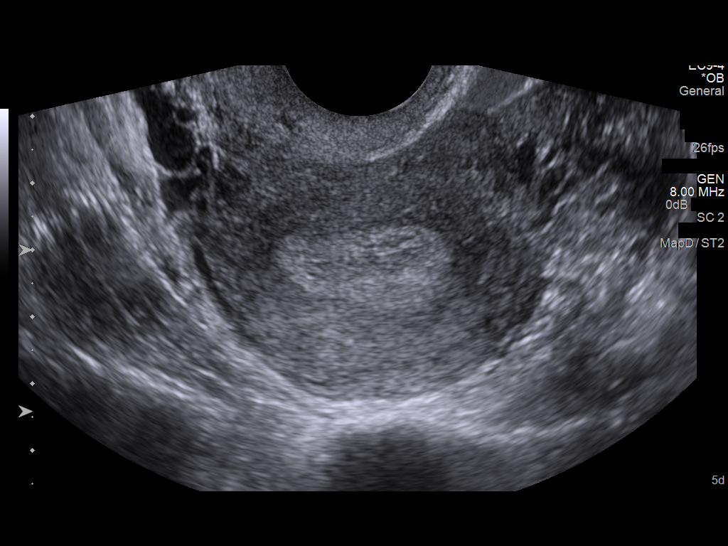
[im 26/37]
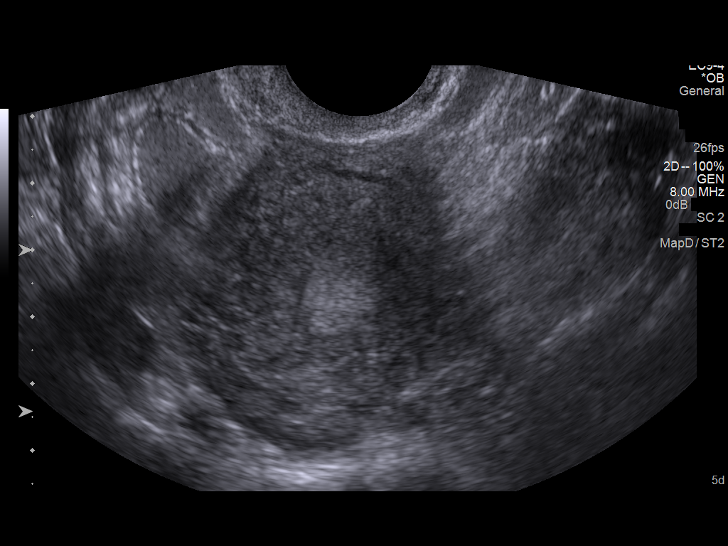
[im 29/37]
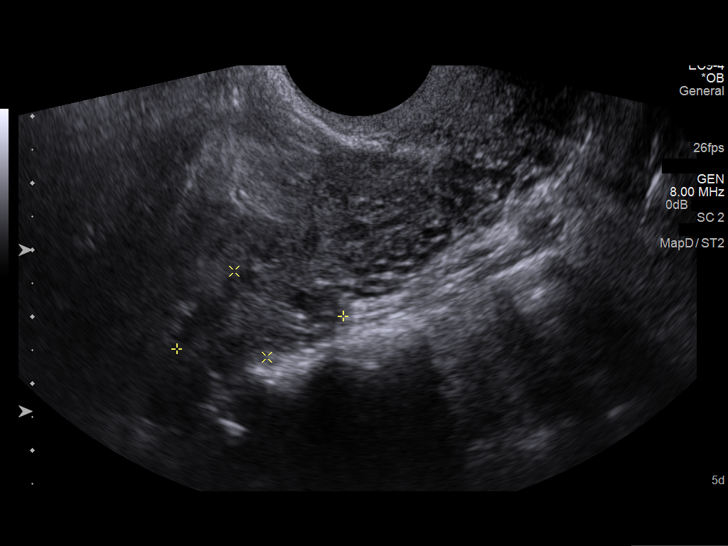
[im 31/37]
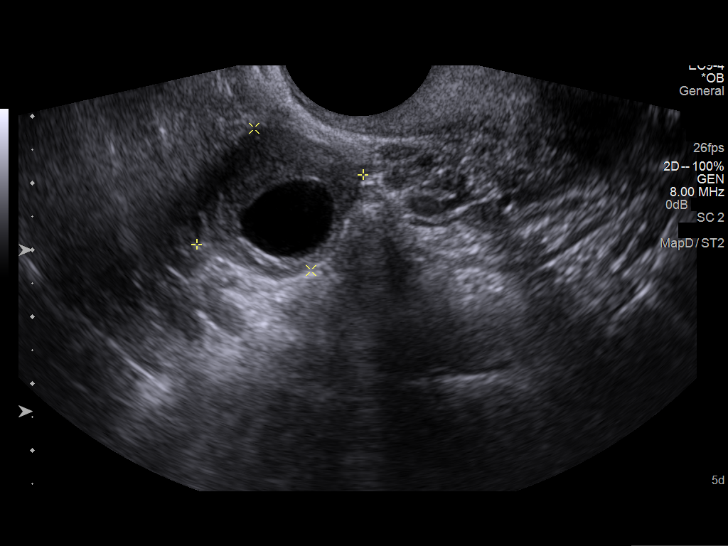
[im 34/37]
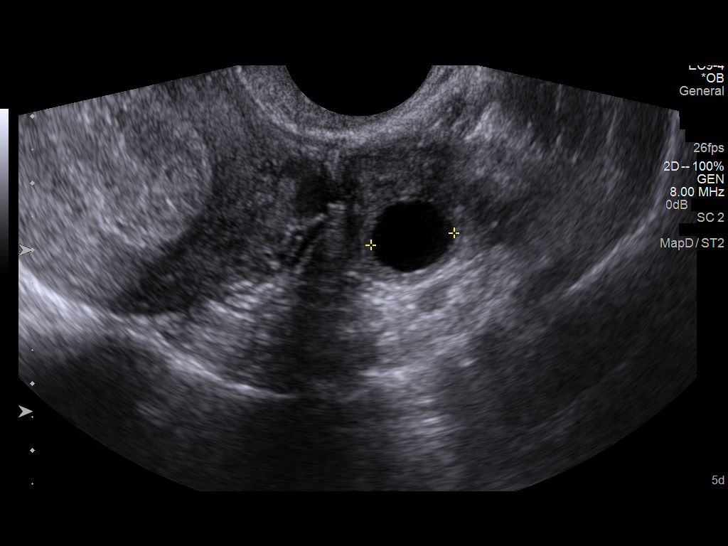
[im 37/37]
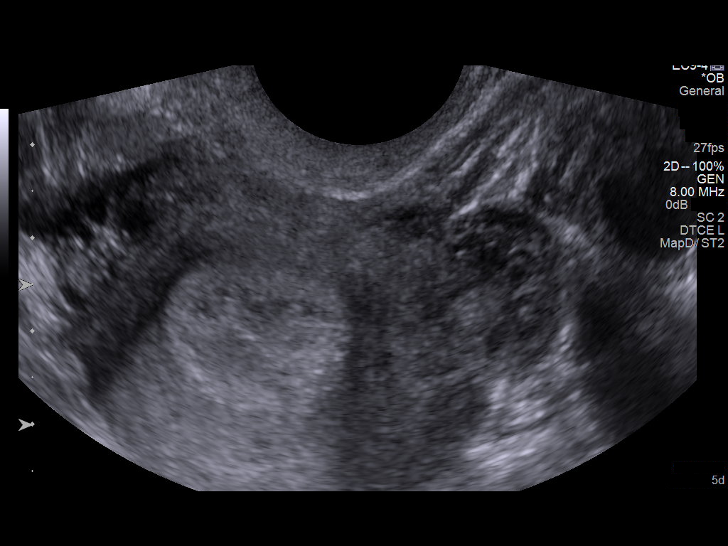

[14 of 28 positions shown; findings below may reference images not displayed]

FINDINGS: Intrauterine gestational sac: Not visualized

Yolk sac:  None present

Embryo:  Not present

Cardiac Activity: Not available

Maternal uterus/adnexae:

Right ovary measures 2.5 x 1.4 x 1.9 cm. There is 1.3 x
cyst/follicle.

Left ovary measures 2.7 x 2.3 x 2.1 cm. There is 1.5 x 1.3 cm cyst.
Small amount of pelvic free fluid is noted.
IMPRESSION: 1. No intrauterine gestation or gestational sac. Bilateral small
ovarian cyst. Small amount of pelvic free fluid is noted. No adnexal
mass is noted. Correlation with beta HCG level and followup
ultrasound is recommended as clinically warranted.

## 2014-04-27 IMAGING — US US OB TRANSVAGINAL
1 series · 14 of 26 positions shown · non-contrast
Comparison: 08/26/13

CLINICAL DATA: Bleeding, abnormal rise in quantitative beta hcg

EXAM:
TRANSVAGINAL OB ULTRASOUND
TECHNIQUE: Transvaginal ultrasound was performed for complete evaluation of the
gestation as well as the maternal uterus, adnexal regions, and
pelvic cul-de-sac.

[Series 1: us ob transvaginal · 26 acquisitions, 14 frames shown]
[im 1/26]
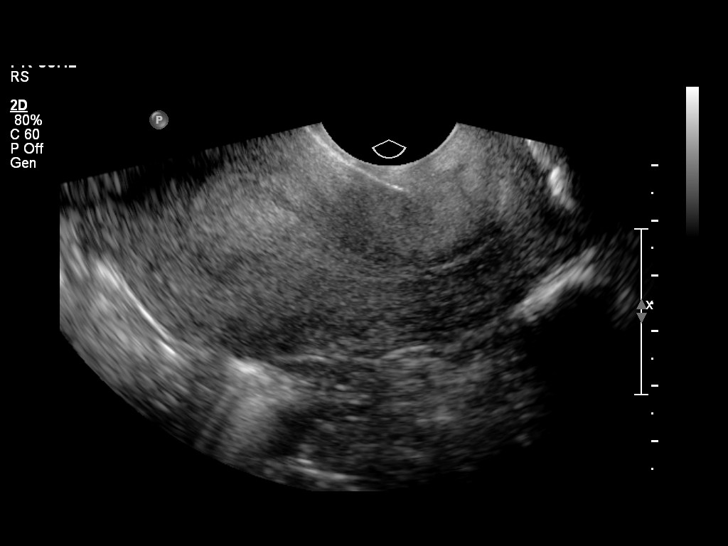
[im 3/26]
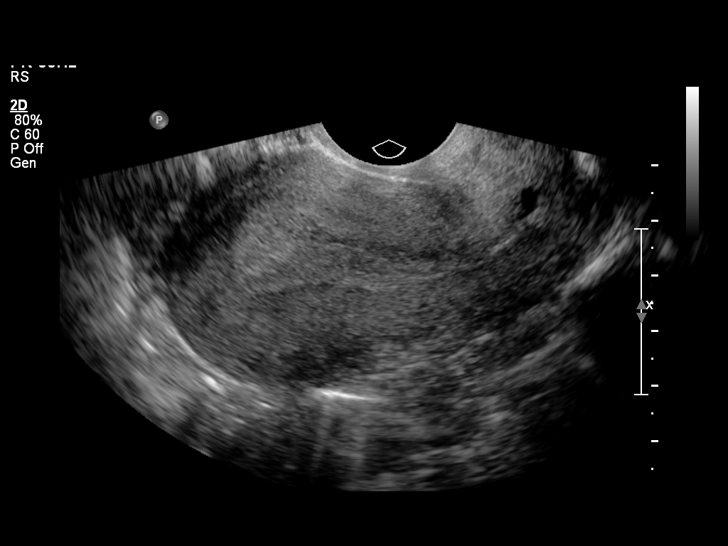
[im 5/26]
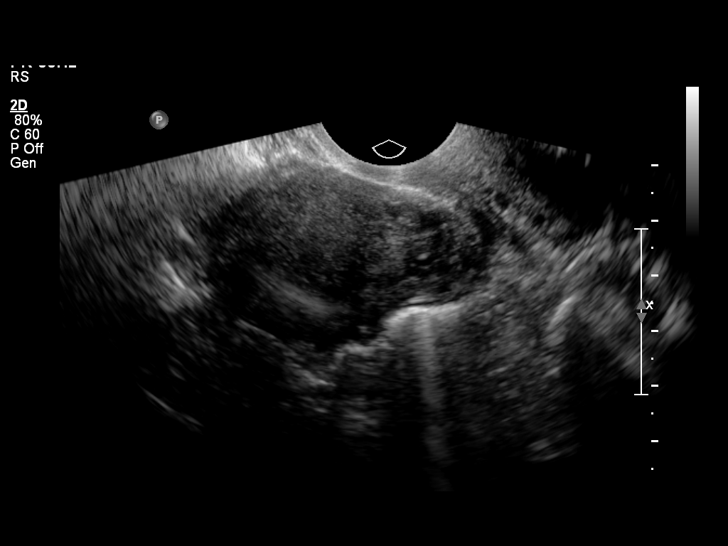
[im 7/26]
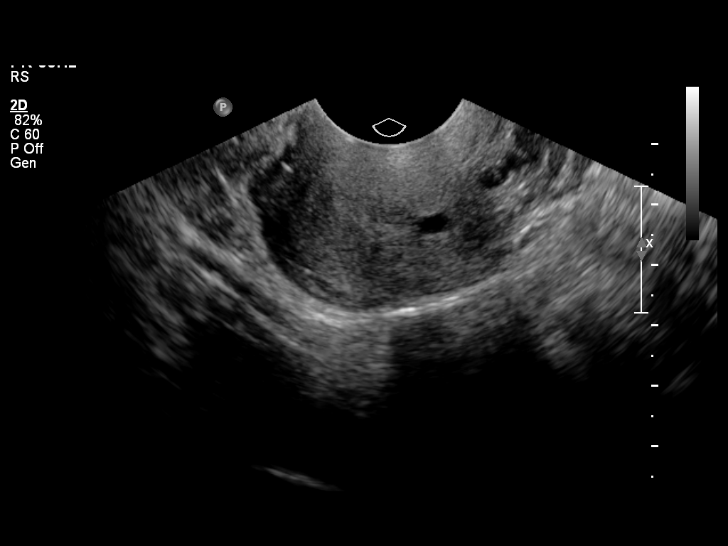
[im 9/26]
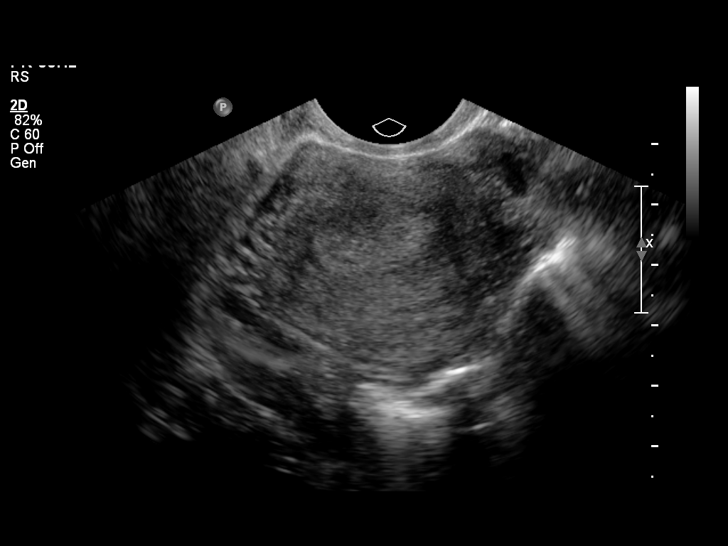
[im 11/26]
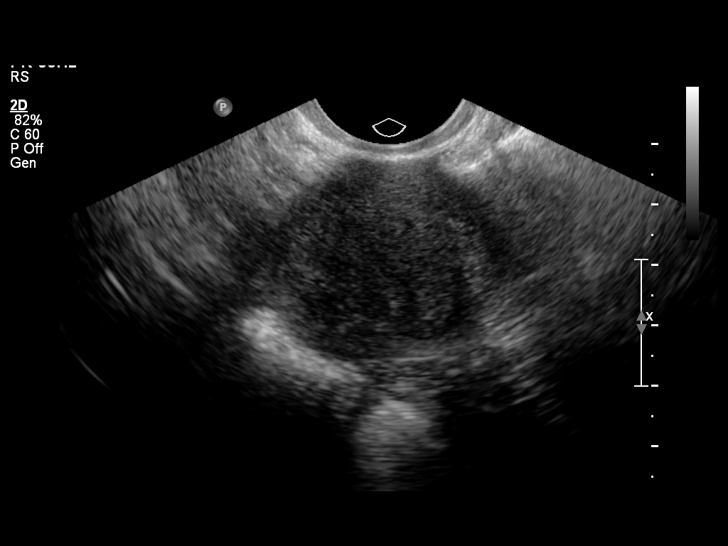
[im 13/26]
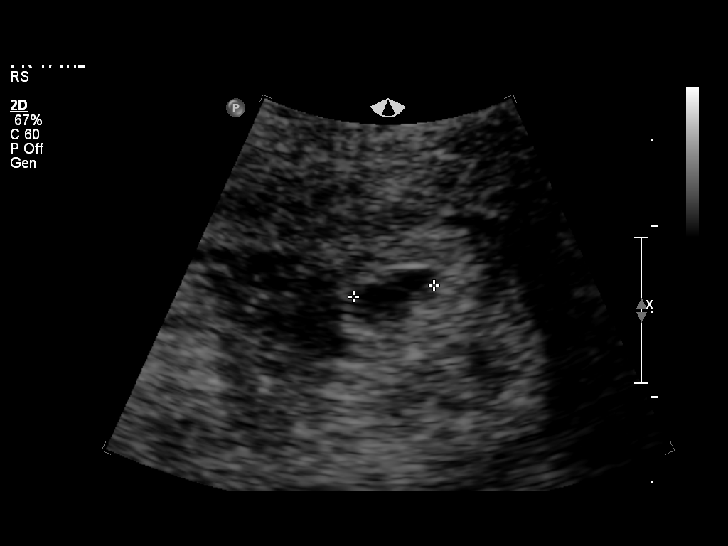
[im 14/26]
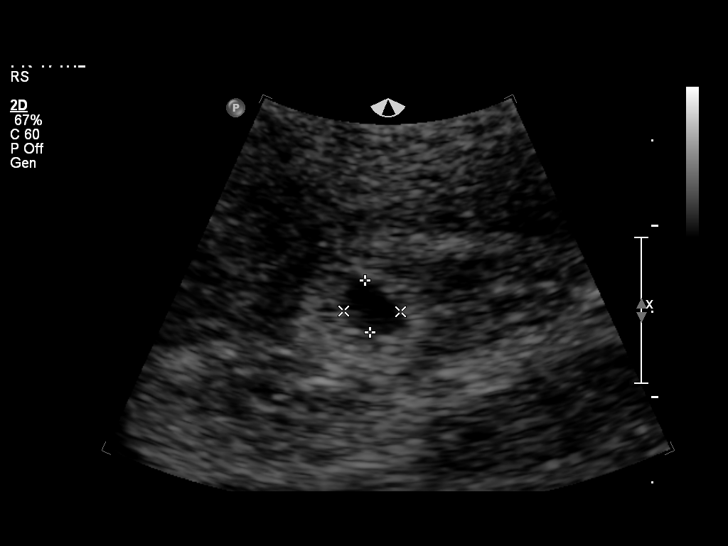
[im 16/26]
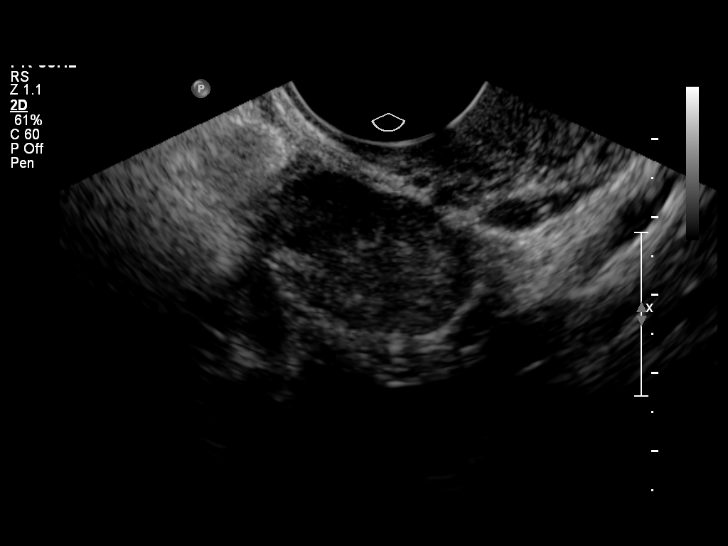
[im 18/26]
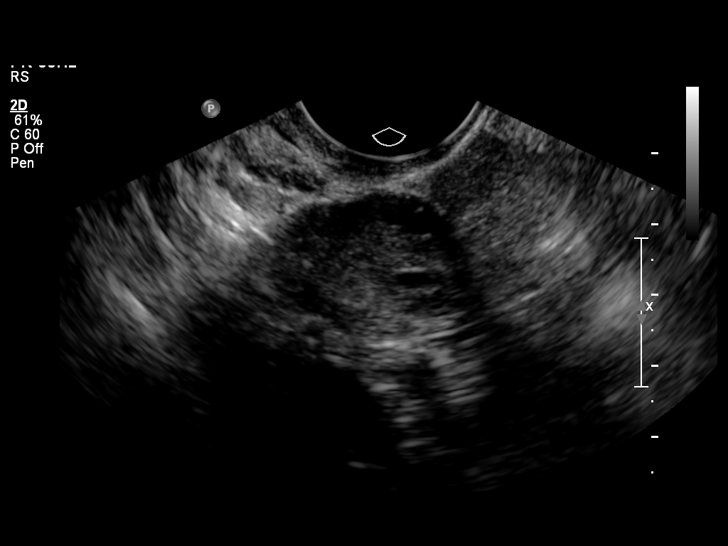
[im 20/26]
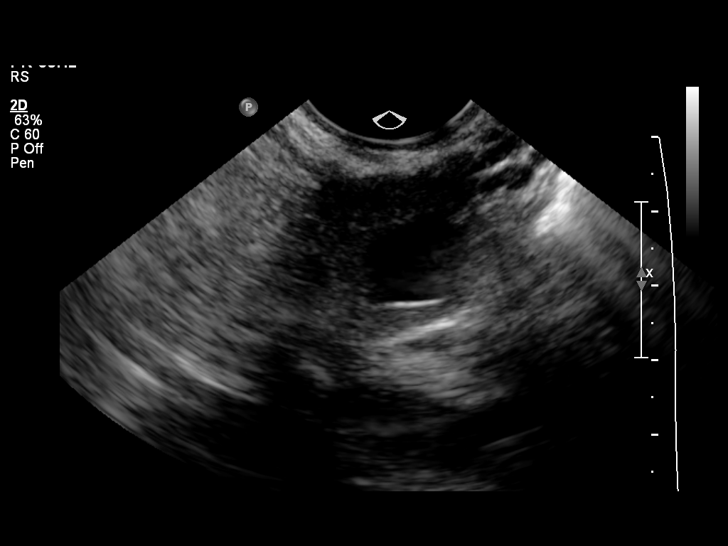
[im 22/26]
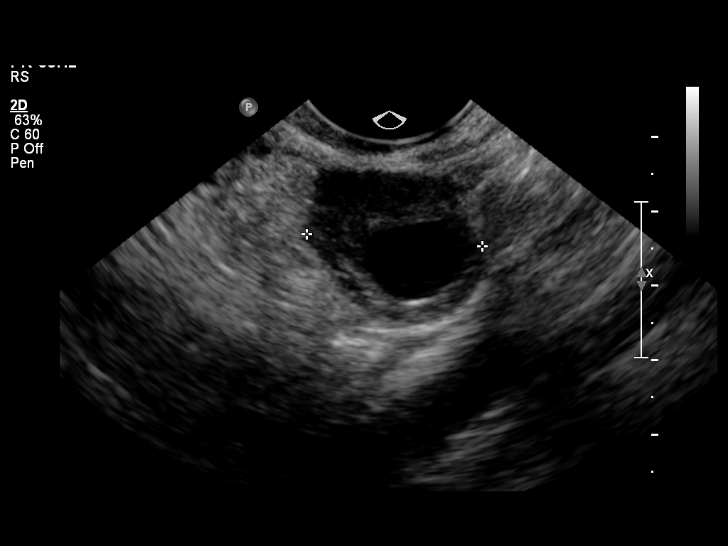
[im 24/26]
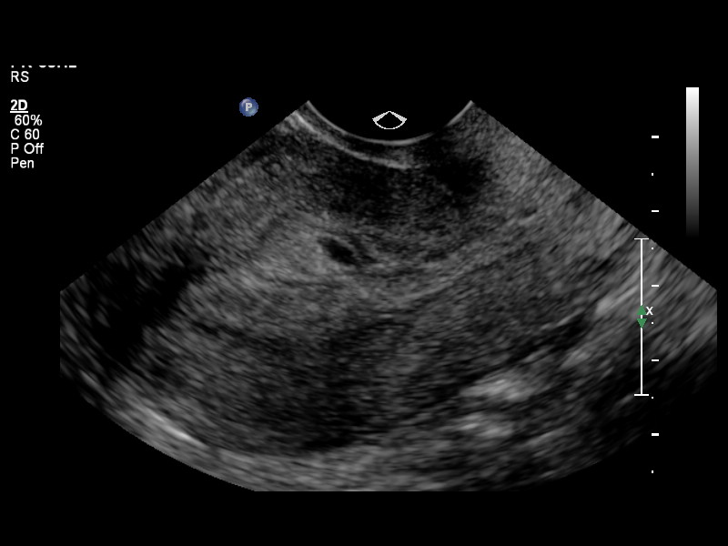
[im 26/26]
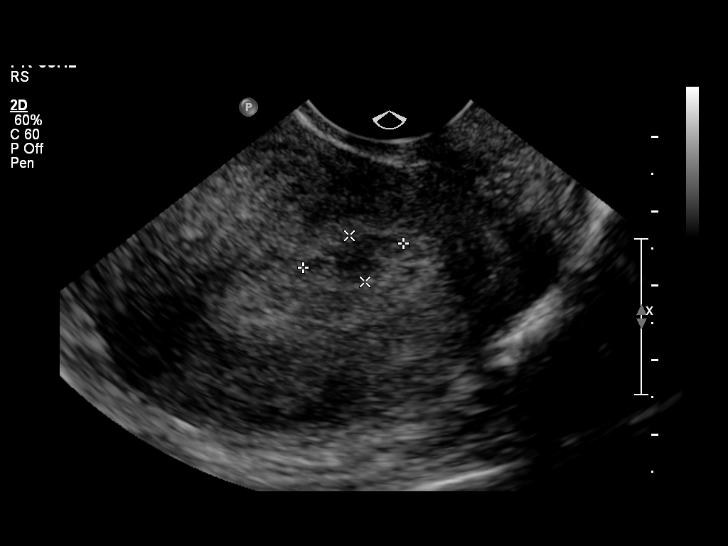

[14 of 26 positions shown; findings below may reference images not displayed]

FINDINGS: Intrauterine gestational sac: V there is an oval anechoic structure
within the endometrial canal with a mean diameter of 3.7 mm. If this
is a gestational sac it would correlate with estimated gestational
age of 4 weeks 6 days.

Yolk sac:  Not identified

Embryo:  Not identified

Cardiac Activity: Not identified

Heart Rate: Not applicable bpm

Maternal uterus/adnexae: Small fluid collection suggesting
subchorionic hemorrhage measuring about 1.5 cm. Ovaries appear
normal.
IMPRESSION: Probable early gestational sac without any further landmarks.
Recommend close ultrasound followup.

## 2014-04-28 ENCOUNTER — Ambulatory Visit (INDEPENDENT_AMBULATORY_CARE_PROVIDER_SITE_OTHER): Payer: Medicaid Other | Admitting: Family

## 2014-04-28 VITALS — BP 102/63 | HR 109 | Wt 172.0 lb

## 2014-04-28 DIAGNOSIS — Z3483 Encounter for supervision of other normal pregnancy, third trimester: Secondary | ICD-10-CM

## 2014-04-28 DIAGNOSIS — Z3480 Encounter for supervision of other normal pregnancy, unspecified trimester: Secondary | ICD-10-CM

## 2014-04-28 NOTE — Progress Notes (Signed)
Doing well.  No questions or concerns.  Reviewed GBS and GC/CT screening for next visit.

## 2014-05-12 ENCOUNTER — Encounter: Payer: Self-pay | Admitting: Advanced Practice Midwife

## 2014-05-12 ENCOUNTER — Ambulatory Visit (INDEPENDENT_AMBULATORY_CARE_PROVIDER_SITE_OTHER): Payer: Medicaid Other | Admitting: Advanced Practice Midwife

## 2014-05-12 VITALS — BP 108/65 | HR 98 | Wt 172.0 lb

## 2014-05-12 DIAGNOSIS — Z3483 Encounter for supervision of other normal pregnancy, third trimester: Secondary | ICD-10-CM

## 2014-05-12 DIAGNOSIS — Z3685 Encounter for antenatal screening for Streptococcus B: Secondary | ICD-10-CM

## 2014-05-12 DIAGNOSIS — Z3480 Encounter for supervision of other normal pregnancy, unspecified trimester: Secondary | ICD-10-CM

## 2014-05-12 DIAGNOSIS — Z113 Encounter for screening for infections with a predominantly sexual mode of transmission: Secondary | ICD-10-CM

## 2014-05-12 LAB — OB RESULTS CONSOLE GC/CHLAMYDIA
CHLAMYDIA, DNA PROBE: NEGATIVE
Gonorrhea: NEGATIVE

## 2014-05-12 LAB — OB RESULTS CONSOLE GBS: GBS: NEGATIVE

## 2014-05-13 LAB — GC/CHLAMYDIA PROBE AMP
CT PROBE, AMP APTIMA: NEGATIVE
GC Probe RNA: NEGATIVE

## 2014-05-16 LAB — CULTURE, BETA STREP (GROUP B ONLY)

## 2014-05-16 NOTE — Progress Notes (Signed)
Cultures done. Labor precautions.

## 2014-05-16 NOTE — Patient Instructions (Signed)

## 2014-05-19 ENCOUNTER — Ambulatory Visit (INDEPENDENT_AMBULATORY_CARE_PROVIDER_SITE_OTHER): Payer: Medicaid Other | Admitting: Advanced Practice Midwife

## 2014-05-19 VITALS — BP 102/64 | HR 97 | Wt 173.0 lb

## 2014-05-19 DIAGNOSIS — Z3480 Encounter for supervision of other normal pregnancy, unspecified trimester: Secondary | ICD-10-CM

## 2014-05-19 DIAGNOSIS — Z3483 Encounter for supervision of other normal pregnancy, third trimester: Secondary | ICD-10-CM

## 2014-05-19 NOTE — Patient Instructions (Signed)
Braxton Hicks Contractions °Contractions of the uterus can occur throughout pregnancy. Contractions are not always a sign that you are in labor.  °WHAT ARE BRAXTON HICKS CONTRACTIONS?  °Contractions that occur before labor are called Braxton Hicks contractions, or false labor. Toward the end of pregnancy (32-34 weeks), these contractions can develop more often and may become more forceful. This is not true labor because these contractions do not result in opening (dilatation) and thinning of the cervix. They are sometimes difficult to tell apart from true labor because these contractions can be forceful and people have different pain tolerances. You should not feel embarrassed if you go to the hospital with false labor. Sometimes, the only way to tell if you are in true labor is for your health care provider to look for changes in the cervix. °If there are no prenatal problems or other health problems associated with the pregnancy, it is completely safe to be sent home with false labor and await the onset of true labor. °HOW CAN YOU TELL THE DIFFERENCE BETWEEN TRUE AND FALSE LABOR? °False Labor °· The contractions of false labor are usually shorter and not as hard as those of true labor.   °· The contractions are usually irregular.   °· The contractions are often felt in the front of the lower abdomen and in the groin.   °· The contractions may go away when you walk around or change positions while lying down.   °· The contractions get weaker and are shorter lasting as time goes on.   °· The contractions do not usually become progressively stronger, regular, and closer together as with true labor.   °True Labor °· Contractions in true labor last 30-70 seconds, become very regular, usually become more intense, and increase in frequency.   °· The contractions do not go away with walking.   °· The discomfort is usually felt in the top of the uterus and spreads to the lower abdomen and low back.   °· True labor can be  determined by your health care provider with an exam. This will show that the cervix is dilating and getting thinner.   °WHAT TO REMEMBER °· Keep up with your usual exercises and follow other instructions given by your health care provider.   °· Take medicines as directed by your health care provider.   °· Keep your regular prenatal appointments.   °· Eat and drink lightly if you think you are going into labor.   °· If Braxton Hicks contractions are making you uncomfortable:   °¨ Change your position from lying down or resting to walking, or from walking to resting.   °¨ Sit and rest in a tub of warm water.   °¨ Drink 2-3 glasses of water. Dehydration may cause these contractions.   °¨ Do slow and deep breathing several times an hour.   °WHEN SHOULD I SEEK IMMEDIATE MEDICAL CARE? °Seek immediate medical care if: °· Your contractions become stronger, more regular, and closer together.   °· You have fluid leaking or gushing from your vagina.   °· You have a fever.   °· You pass blood-tinged mucus.   °· You have vaginal bleeding.   °· You have continuous abdominal pain.   °· You have low back pain that you never had before.   °· You feel your baby's head pushing down and causing pelvic pressure.   °· Your baby is not moving as much as it used to.   °Document Released: 10/20/2005 Document Revised: 10/25/2013 Document Reviewed: 08/01/2013 °Exit[employer]® Patient Information ©2015 [employer]. This information is not intended to replace advice given to you by your health care   provider. Make sure you discuss any questions you have with your health care provider. ° °

## 2014-05-26 ENCOUNTER — Ambulatory Visit (INDEPENDENT_AMBULATORY_CARE_PROVIDER_SITE_OTHER): Payer: Medicaid Other | Admitting: Family

## 2014-05-26 VITALS — BP 102/64 | HR 96 | Wt 176.0 lb

## 2014-05-26 DIAGNOSIS — Z3480 Encounter for supervision of other normal pregnancy, unspecified trimester: Secondary | ICD-10-CM

## 2014-05-26 DIAGNOSIS — Z3483 Encounter for supervision of other normal pregnancy, third trimester: Secondary | ICD-10-CM

## 2014-05-26 NOTE — Progress Notes (Signed)
No questions or concerns.  Occasional braxton hicks.

## 2014-06-02 ENCOUNTER — Ambulatory Visit (INDEPENDENT_AMBULATORY_CARE_PROVIDER_SITE_OTHER): Payer: Medicaid Other | Admitting: Advanced Practice Midwife

## 2014-06-02 VITALS — BP 106/70 | HR 112 | Wt 177.0 lb

## 2014-06-02 DIAGNOSIS — Z3483 Encounter for supervision of other normal pregnancy, third trimester: Secondary | ICD-10-CM

## 2014-06-02 DIAGNOSIS — Z3480 Encounter for supervision of other normal pregnancy, unspecified trimester: Secondary | ICD-10-CM

## 2014-06-02 NOTE — Progress Notes (Signed)
Active baby. Membranes swept per maternal request. Normal bloody show.

## 2014-06-02 NOTE — Patient Instructions (Signed)
Braxton Hicks Contractions Contractions of the uterus can occur throughout pregnancy. Contractions are not always a sign that you are in labor.  WHAT ARE BRAXTON HICKS CONTRACTIONS?  Contractions that occur before labor are called Braxton Hicks contractions, or false labor. Toward the end of pregnancy (32-34 weeks), these contractions can develop more often and may become more forceful. This is not true labor because these contractions do not result in opening (dilatation) and thinning of the cervix. They are sometimes difficult to tell apart from true labor because these contractions can be forceful and people have different pain tolerances. You should not feel embarrassed if you go to the hospital with false labor. Sometimes, the only way to tell if you are in true labor is for your health care provider to look for changes in the cervix. If there are no prenatal problems or other health problems associated with the pregnancy, it is completely safe to be sent home with false labor and await the onset of true labor. HOW CAN YOU TELL THE DIFFERENCE BETWEEN TRUE AND FALSE LABOR? False Labor  The contractions of false labor are usually shorter and not as hard as those of true labor.   The contractions are usually irregular.   The contractions are often felt in the front of the lower abdomen and in the groin.   The contractions may go away when you walk around or change positions while lying down.   The contractions get weaker and are shorter lasting as time goes on.   The contractions do not usually become progressively stronger, regular, and closer together as with true labor.  True Labor  Contractions in true labor last 30-70 seconds, become very regular, usually become more intense, and increase in frequency.   The contractions do not go away with walking.   The discomfort is usually felt in the top of the uterus and spreads to the lower abdomen and low back.   True labor can be  determined by your health care provider with an exam. This will show that the cervix is dilating and getting thinner.  WHAT TO REMEMBER  Keep up with your usual exercises and follow other instructions given by your health care provider.   Take medicines as directed by your health care provider.   Keep your regular prenatal appointments.   Eat and drink lightly if you think you are going into labor.   If Braxton Hicks contractions are making you uncomfortable:   Change your position from lying down or resting to walking, or from walking to resting.   Sit and rest in a tub of warm water.   Drink 2-3 glasses of water. Dehydration may cause these contractions.   Do slow and deep breathing several times an hour.  WHEN SHOULD I SEEK IMMEDIATE MEDICAL CARE? Seek immediate medical care if:  Your contractions become stronger, more regular, and closer together.   You have fluid leaking or gushing from your vagina.   You have a fever.   You pass blood-tinged mucus.   You have vaginal bleeding.   You have continuous abdominal pain.   You have low back pain that you never had before.   You feel your baby's head pushing down and causing pelvic pressure.   Your baby is not moving as much as it used to.  Document Released: 10/20/2005 Document Revised: 10/25/2013 Document Reviewed: 08/01/2013 Exit[employer] Patient Information 2015 [employer]. This information is not intended to replace advice given to you by your health care   provider. Make sure you discuss any questions you have with your health care provider.  Fetal Movement Counts Patient Name: __________________________________________________ Patient Due Date: ____________________ Performing a fetal movement count is highly recommended in high-risk pregnancies, but it is good for every pregnant woman to do. Your health care provider may ask you to start counting fetal movements at 28 weeks of the pregnancy. Fetal  movements often increase:  After eating a full meal.  After physical activity.  After eating or drinking something sweet or cold.  At rest. Pay attention to when you feel the baby is most active. This will help you notice a pattern of your baby's sleep and wake cycles and what factors contribute to an increase in fetal movement. It is important to perform a fetal movement count at the same time each day when your baby is normally most active.  HOW TO COUNT FETAL MOVEMENTS 1. Find a quiet and comfortable area to sit or lie down on your left side. Lying on your left side provides the best blood and oxygen circulation to your baby. 2. Write down the day and time on a sheet of paper or in a journal. 3. Start counting kicks, flutters, swishes, rolls, or jabs in a 2-hour period. You should feel at least 10 movements within 2 hours. 4. If you do not feel 10 movements in 2 hours, wait 2-3 hours and count again. Look for a change in the pattern or not enough counts in 2 hours. SEEK MEDICAL CARE IF:  You feel less than 10 counts in 2 hours, tried twice.  There is no movement in over an hour.  The pattern is changing or taking longer each day to reach 10 counts in 2 hours.  You feel the baby is not moving as he or she usually does. Date: ____________ Movements: ____________ Start time: ____________ Finish time: ____________  Date: ____________ Movements: ____________ Start time: ____________ Finish time: ____________ Date: ____________ Movements: ____________ Start time: ____________ Finish time: ____________ Date: ____________ Movements: ____________ Start time: ____________ Finish time: ____________ Date: ____________ Movements: ____________ Start time: ____________ Finish time: ____________ Date: ____________ Movements: ____________ Start time: ____________ Finish time: ____________ Date: ____________ Movements: ____________ Start time: ____________ Finish time: ____________ Date: ____________  Movements: ____________ Start time: ____________ Finish time: ____________  Date: ____________ Movements: ____________ Start time: ____________ Finish time: ____________ Date: ____________ Movements: ____________ Start time: ____________ Finish time: ____________ Date: ____________ Movements: ____________ Start time: ____________ Finish time: ____________ Date: ____________ Movements: ____________ Start time: ____________ Finish time: ____________ Date: ____________ Movements: ____________ Start time: ____________ Finish time: ____________ Date: ____________ Movements: ____________ Start time: ____________ Finish time: ____________ Date: ____________ Movements: ____________ Start time: ____________ Finish time: ____________  Date: ____________ Movements: ____________ Start time: ____________ Finish time: ____________ Date: ____________ Movements: ____________ Start time: ____________ Finish time: ____________ Date: ____________ Movements: ____________ Start time: ____________ Finish time: ____________ Date: ____________ Movements: ____________ Start time: ____________ Finish time: ____________ Date: ____________ Movements: ____________ Start time: ____________ Finish time: ____________ Date: ____________ Movements: ____________ Start time: ____________ Finish time: ____________ Date: ____________ Movements: ____________ Start time: ____________ Finish time: ____________  Date: ____________ Movements: ____________ Start time: ____________ Finish time: ____________ Date: ____________ Movements: ____________ Start time: ____________ Finish time: ____________ Date: ____________ Movements: ____________ Start time: ____________ Finish time: ____________ Date: ____________ Movements: ____________ Start time: ____________ Finish time: ____________ Date: ____________ Movements: ____________ Start time: ____________ Finish time: ____________ Date: ____________ Movements: ____________ Start time:  ____________ Finish time: ____________ Date: ____________ Movements:   ____________ Start time: ____________ Finish time: ____________  Date: ____________ Movements: ____________ Start time: ____________ Finish time: ____________ Date: ____________ Movements: ____________ Start time: ____________ Finish time: ____________ Date: ____________ Movements: ____________ Start time: ____________ Finish time: ____________ Date: ____________ Movements: ____________ Start time: ____________ Finish time: ____________ Date: ____________ Movements: ____________ Start time: ____________ Finish time: ____________ Date: ____________ Movements: ____________ Start time: ____________ Finish time: ____________ Date: ____________ Movements: ____________ Start time: ____________ Finish time: ____________  Date: ____________ Movements: ____________ Start time: ____________ Finish time: ____________ Date: ____________ Movements: ____________ Start time: ____________ Finish time: ____________ Date: ____________ Movements: ____________ Start time: ____________ Finish time: ____________ Date: ____________ Movements: ____________ Start time: ____________ Finish time: ____________ Date: ____________ Movements: ____________ Start time: ____________ Finish time: ____________ Date: ____________ Movements: ____________ Start time: ____________ Finish time: ____________ Date: ____________ Movements: ____________ Start time: ____________ Finish time: ____________  Date: ____________ Movements: ____________ Start time: ____________ Finish time: ____________ Date: ____________ Movements: ____________ Start time: ____________ Finish time: ____________ Date: ____________ Movements: ____________ Start time: ____________ Finish time: ____________ Date: ____________ Movements: ____________ Start time: ____________ Finish time: ____________ Date: ____________ Movements: ____________ Start time: ____________ Finish time: ____________ Date:  ____________ Movements: ____________ Start time: ____________ Finish time: ____________ Date: ____________ Movements: ____________ Start time: ____________ Finish time: ____________  Date: ____________ Movements: ____________ Start time: ____________ Finish time: ____________ Date: ____________ Movements: ____________ Start time: ____________ Finish time: ____________ Date: ____________ Movements: ____________ Start time: ____________ Finish time: ____________ Date: ____________ Movements: ____________ Start time: ____________ Finish time: ____________ Date: ____________ Movements: ____________ Start time: ____________ Finish time: ____________ Date: ____________ Movements: ____________ Start time: ____________ Finish time: ____________ Document Released: 11/19/2006 Document Revised: 03/06/2014 Document Reviewed: 08/16/2012 Exit[employer] Patient Information 2015 [employer]. This information is not intended to replace advice given to you by your health care provider. Make sure you discuss any questions you have with your health care provider.  

## 2014-06-09 ENCOUNTER — Encounter (HOSPITAL_COMMUNITY): Payer: Self-pay | Admitting: *Deleted

## 2014-06-09 ENCOUNTER — Telehealth (HOSPITAL_COMMUNITY): Payer: Self-pay | Admitting: *Deleted

## 2014-06-09 ENCOUNTER — Ambulatory Visit (INDEPENDENT_AMBULATORY_CARE_PROVIDER_SITE_OTHER): Payer: Medicaid Other | Admitting: Family

## 2014-06-09 VITALS — BP 113/68 | HR 84 | Wt 180.0 lb

## 2014-06-09 DIAGNOSIS — Z3493 Encounter for supervision of normal pregnancy, unspecified, third trimester: Secondary | ICD-10-CM

## 2014-06-09 DIAGNOSIS — Z3480 Encounter for supervision of other normal pregnancy, unspecified trimester: Secondary | ICD-10-CM

## 2014-06-09 DIAGNOSIS — O480 Post-term pregnancy: Secondary | ICD-10-CM

## 2014-06-09 NOTE — Progress Notes (Signed)
Baby moving well.  No questions or concerns.  Category I NST.  Membranes swept.  IOL scheduled.

## 2014-06-09 NOTE — Telephone Encounter (Signed)
Preadmission screen  

## 2014-06-10 ENCOUNTER — Inpatient Hospital Stay (HOSPITAL_COMMUNITY)
Admission: AD | Admit: 2014-06-10 | Discharge: 2014-06-12 | DRG: 775 | Disposition: A | Discharge status: Home / Self Care | Payer: Medicaid Other | Source: Ambulatory Visit | Attending: Family Medicine | Admitting: Family Medicine

## 2014-06-10 ENCOUNTER — Encounter (HOSPITAL_COMMUNITY): Payer: Self-pay | Admitting: *Deleted

## 2014-06-10 DIAGNOSIS — K900 Celiac disease: Secondary | ICD-10-CM | POA: Diagnosis present

## 2014-06-10 DIAGNOSIS — O479 False labor, unspecified: Secondary | ICD-10-CM | POA: Diagnosis present

## 2014-06-10 DIAGNOSIS — E739 Lactose intolerance, unspecified: Secondary | ICD-10-CM | POA: Diagnosis present

## 2014-06-10 DIAGNOSIS — Z8249 Family history of ischemic heart disease and other diseases of the circulatory system: Secondary | ICD-10-CM

## 2014-06-10 DIAGNOSIS — IMO0001 Reserved for inherently not codable concepts without codable children: Secondary | ICD-10-CM

## 2014-06-10 DIAGNOSIS — O328XX0 Maternal care for other malpresentation of fetus, not applicable or unspecified: Secondary | ICD-10-CM | POA: Diagnosis present

## 2014-06-10 LAB — RPR

## 2014-06-10 LAB — CBC
HCT: 36.9 % (ref 36.0–46.0)
Hemoglobin: 12.7 g/dL (ref 12.0–15.0)
MCH: 28.8 pg (ref 26.0–34.0)
MCHC: 34.4 g/dL (ref 30.0–36.0)
MCV: 83.7 fL (ref 78.0–100.0)
Platelets: 157 K/uL (ref 150–400)
RBC: 4.41 MIL/uL (ref 3.87–5.11)
RDW: 13.8 % (ref 11.5–15.5)
WBC: 18.2 K/uL — ABNORMAL HIGH (ref 4.0–10.5)

## 2014-06-10 MED ORDER — TETANUS-DIPHTH-ACELL PERTUSSIS 5-2.5-18.5 LF-MCG/0.5 IM SUSP: 0.5000 mL | Freq: Once | INTRAMUSCULAR | Status: DC

## 2014-06-10 MED ORDER — OXYCODONE-ACETAMINOPHEN 5-325 MG PO TABS
1.0000 | ORAL_TABLET | ORAL | Status: DC | PRN
  Administered 2014-06-10: 1 via ORAL
  Filled 2014-06-10: qty 1

## 2014-06-10 MED ORDER — DIPHENHYDRAMINE HCL 25 MG PO CAPS: 25.0000 mg | ORAL_CAPSULE | Freq: Four times a day (QID) | ORAL | Status: DC | PRN

## 2014-06-10 MED ORDER — MISOPROSTOL 200 MCG PO TABS
ORAL_TABLET | ORAL | Status: AC
  Filled 2014-06-10: qty 4

## 2014-06-10 MED ORDER — ONDANSETRON HCL 4 MG/2ML IJ SOLN: 4.0000 mg | Freq: Four times a day (QID) | INTRAMUSCULAR | Status: DC | PRN

## 2014-06-10 MED ORDER — WITCH HAZEL-GLYCERIN EX PADS
1.0000 "application " | MEDICATED_PAD | CUTANEOUS | Status: DC | PRN
  Administered 2014-06-10: 1 via TOPICAL

## 2014-06-10 MED ORDER — IBUPROFEN 600 MG PO TABS
600.0000 mg | ORAL_TABLET | Freq: Four times a day (QID) | ORAL | Status: DC | PRN
  Administered 2014-06-10 – 2014-06-12 (×8): 600 mg via ORAL
  Filled 2014-06-10 (×8): qty 1

## 2014-06-10 MED ORDER — CITRIC ACID-SODIUM CITRATE 334-500 MG/5ML PO SOLN: 30.0000 mL | ORAL | Status: DC | PRN

## 2014-06-10 MED ORDER — OXYTOCIN 40 UNITS IN LACTATED RINGERS INFUSION - SIMPLE MED
62.5000 mL/h | INTRAVENOUS | Status: DC
  Administered 2014-06-10: 62.5 mL/h via INTRAVENOUS
  Filled 2014-06-10: qty 1000

## 2014-06-10 MED ORDER — FENTANYL CITRATE 0.05 MG/ML IJ SOLN
100.0000 ug | INTRAMUSCULAR | Status: DC | PRN
  Administered 2014-06-10: 100 ug via INTRAVENOUS
  Filled 2014-06-10: qty 2

## 2014-06-10 MED ORDER — DIBUCAINE 1 % RE OINT: 1.0000 "application " | TOPICAL_OINTMENT | RECTAL | Status: DC | PRN

## 2014-06-10 MED ORDER — LACTATED RINGERS IV SOLN: INTRAVENOUS | Status: DC

## 2014-06-10 MED ORDER — ACETAMINOPHEN 325 MG PO TABS: 650.0000 mg | ORAL_TABLET | ORAL | Status: DC | PRN

## 2014-06-10 MED ORDER — LANOLIN HYDROUS EX OINT: TOPICAL_OINTMENT | CUTANEOUS | Status: DC | PRN

## 2014-06-10 MED ORDER — SIMETHICONE 80 MG PO CHEW
80.0000 mg | CHEWABLE_TABLET | ORAL | Status: DC | PRN
  Filled 2014-06-10: qty 1

## 2014-06-10 MED ORDER — IBUPROFEN 600 MG PO TABS: 600.0000 mg | ORAL_TABLET | Freq: Four times a day (QID) | ORAL | Status: DC | PRN

## 2014-06-10 MED ORDER — OXYTOCIN BOLUS FROM INFUSION: 500.0000 mL | INTRAVENOUS | Status: DC

## 2014-06-10 MED ORDER — ONDANSETRON HCL 4 MG PO TABS: 4.0000 mg | ORAL_TABLET | ORAL | Status: DC | PRN

## 2014-06-10 MED ORDER — LACTATED RINGERS IV SOLN
500.0000 mL | INTRAVENOUS | Status: DC | PRN
Start: 2014-06-10 — End: 2014-06-12

## 2014-06-10 MED ORDER — LIDOCAINE HCL (PF) 1 % IJ SOLN
30.0000 mL | INTRAMUSCULAR | Status: AC | PRN
  Administered 2014-06-10: 30 mL via SUBCUTANEOUS
  Filled 2014-06-10: qty 30

## 2014-06-10 MED ORDER — MISOPROSTOL 200 MCG PO TABS
800.0000 ug | ORAL_TABLET | Freq: Once | ORAL | Status: AC
  Administered 2014-06-10: 800 ug via RECTAL

## 2014-06-10 MED ORDER — OXYCODONE-ACETAMINOPHEN 5-325 MG PO TABS: 1.0000 | ORAL_TABLET | ORAL | Status: DC | PRN

## 2014-06-10 MED ORDER — BENZOCAINE-MENTHOL 20-0.5 % EX AERO
1.0000 "application " | INHALATION_SPRAY | CUTANEOUS | Status: DC | PRN
  Administered 2014-06-10: 1 via TOPICAL
  Filled 2014-06-10: qty 56

## 2014-06-10 MED ORDER — FLEET ENEMA 7-19 GM/118ML RE ENEM: 1.0000 | ENEMA | RECTAL | Status: DC | PRN

## 2014-06-10 MED ORDER — ONDANSETRON HCL 4 MG/2ML IJ SOLN: 4.0000 mg | INTRAMUSCULAR | Status: DC | PRN

## 2014-06-10 NOTE — H&P (Signed)
LABOR ADMISSION HISTORY AND PHYSICAL  Brett AlbinoMaria Narula is a 28 y.o. female G3P1011 with IUP at [id number] by EDC cw 12w sono. Pt presents w/ contractions q44min, started last night but worse since this morning. She reports +FMs, No LOF, no VB, no blurry vision, headaches or peripheral edema, and RUQ pain. She declines an epidural for labor pain control. She plans on breast feeding. She request paraguard for birth control.  Dating: By EDc cw 12w sono --->  Estimated Date of Delivery: 06/04/14  Prenatal History/Complications:  Past Medical History: Past Medical History  Diagnosis Date  . Gluten intolerance   . Lactose intolerance     Past Surgical History: Past Surgical History  Procedure Laterality Date  . No past surgeries      Obstetrical History: OB History   Grav Para Term Preterm Abortions TAB SAB Ect Mult Living   3 1 1  1  1   1        Social History: History   Social History  . Marital Status: Single    Spouse Name: N/A    Number of Children: N/A  . Years of Education: N/A   Social History Main Topics  . Smoking status: Never Smoker   . Smokeless tobacco: Never Used  . Alcohol Use: Yes     Comment: occ  . Drug Use: No  . Sexual Activity: Yes    Birth Control/ Protection: None   Other Topics Concern  . None   Social History Narrative  . None    Family History: Family History  Problem Relation Age of Onset  . Hypertension Father     Allergies: Allergies  Allergen Reactions  . Gluten Meal Other (See Comments)    Gi upset  . Lactose Intolerance (Gi) Other (See Comments)    Gi upset  . Yellow Dyes (Non-Tartrazine) Hives and Swelling    Yellow #5, lips swelling, palms of hands & feet and ears burn    Facility-administered medications prior to admission  Medication Dose Route Frequency Provider Last Rate Last Dose  . Tdap (BOOSTRIX) injection 0.5 mL  0.5 mL Intramuscular Once Tereso NewcomerUgonna A Anyanwu, MD       Prescriptions prior to admission  Medication Sig  Dispense Refill  . Prenatal Vit-Fe Fumarate-FA (MULTIVITAMIN-PRENATAL) 27-0.8 MG TABS tablet Take 1 tablet by mouth daily at 12 noon.         Review of Systems   All systems reviewed and negative except as stated in HPI  Blood pressure 95/56, pulse 85, temperature 98.4 F (36.9 C), temperature source Oral, resp. rate 16, height 5\' 3"  (1.6 m), weight 81.647 kg (180 lb). General appearance: alert, cooperative and moderate distress with contractions Lungs: clear to auscultation bilaterally Heart: regular rate and rhythm Abdomen: soft, non-tender; bowel sounds normal Pelvic: examined by nurse Extremities: Homans sign is negative, no sign of DVT DTR's not examined Presentation: cephalic  By nurse's exam Fetal monitoringBaseline: 140 bpm, no accels, +variable decels Uterine activity q3-285min  Dilation: 4.5 Effacement (%): 90 Station: -1 Exam by:: Dellie BurnsScarlett Murray, RN BSN   Prenatal labs: ABO, Rh: O/POS/-- (01/22 1000) Antibody: NEG (01/22 1000) Rubella:   RPR: NON REAC (05/08 0934)  HBsAg: NEGATIVE (01/22 1000)  HIV: NONREACTIVE (05/08 0934)  GBS: Negative (07/10 0000)  1 hr Glucola 103 Genetic screening  declined Anatomy US normal   Prenatal Transfer Tool  Maternal Diabetes: No Genetic Screening: Declined Maternal Ultrasounds/Referrals: Normal Fetal Ultrasounds or other Referrals:  None Maternal Substance Abuse:  No  Significant Maternal Medications:  None Significant Maternal Lab Results: None     No results found for this or any previous visit (from the past 24 hour(s)).  Patient Active Problem List   Diagnosis Date Noted  . Atypical squamous cells of undetermined significance (ASCUS) on Papanicolaou smear of cervix 11/24/2013  . Supervision of other normal pregnancy 11/24/2013    Assessment: Levette Paulick is a 28 y.o. G3P1011 at [redacted]w[redacted]d here for active labor  #Labor: progressing normally #Pain: Without medications or epidural, readdress if needed/per pt  request #FWB:  cat I #ID:  GBS neg #MOF: breast #MOC: paraguard #Circ:  N/a  May labor in tub as currently declining pain medication, not to delivery in water, risks and benefits discussed.  Dink Creps ROCIO 06/10/2014, 10:55 AM

## 2014-06-10 NOTE — MAU Note (Signed)
Patient presents with complaint of contractions since last night that intensified at 0800 today.

## 2014-06-11 LAB — CBC
HCT: 33.5 % — ABNORMAL LOW (ref 36.0–46.0)
Hemoglobin: 11.4 g/dL — ABNORMAL LOW (ref 12.0–15.0)
MCH: 28.6 pg (ref 26.0–34.0)
MCHC: 34 g/dL (ref 30.0–36.0)
MCV: 84.2 fL (ref 78.0–100.0)
PLATELETS: 150 10*3/uL (ref 150–400)
RBC: 3.98 MIL/uL (ref 3.87–5.11)
RDW: 14.1 % (ref 11.5–15.5)
WBC: 23.8 10*3/uL — AB (ref 4.0–10.5)

## 2014-06-11 NOTE — Progress Notes (Signed)
Post Partum Day 1 NSVD @ 3:03 pm 06/10/14  Subjective: no complaints, up ad lib, voiding and + flatus  Objective: Blood pressure 97/49, pulse 66, temperature 98.4 F (36.9 C), temperature source Oral, resp. rate 20, height 5\' 3"  (1.6 m), weight 81.647 kg (180 lb), SpO2 98.00%, unknown if currently breastfeeding.  Physical Exam:  General: alert, cooperative and no distress Lochia: appropriate Uterine Fundus: firm Incision: n/a DVT Evaluation: No evidence of DVT seen on physical exam. Negative Homan's sign. No cords or calf tenderness. No significant calf/ankle edema.   Recent Labs  06/10/14 1135 06/11/14 0551  HGB 12.7 11.4*  HCT 36.9 33.5*    Assessment/Plan: Plan for discharge tomorrow and Contraception Paraguard   LOS: 1 day   LEFTWICH-KIRBY, Judyth Demarais 06/11/2014, 8:52 AM

## 2014-06-11 NOTE — Lactation Note (Signed)
This note was copied from the chart of Rachel Jackson. Lactation Consultation Note  Patient Name: Rachel Brett AlbinoMaria Jackson ZOXWR'UToday's Date: 06/11/2014 Reason for consult: Follow-up assessment;Hyperbilirubinemia;Infant weight loss;MD order LC provided foley cup and curved-tip syringes and demonstrated technique for use as alternative method to feed ebm (or formula as needed). Both parents verbalize understanding of use and cleaning.  Mom has been started with DEBP to obtain ebm as supplement to increase baby's intake and output due to elevated bilirubin level.  Mom says she can hand express drops of milk as well and that baby is latching well and she hears swallows during baby's breastfeedings. LC encouraged her to continue ad lib breastfeeding with puming between feedings for additional milk.   Maternal Data    Feeding Feeding Type: Breast Fed Length of feed: 10 min  LATCH Score/Interventions         Most recent LATCH score=10 and baby has output which exceeds minimum for this hour of life; feedings of 10-30 minutes per feeding noted and mom breastfed first child for 1 year without problems             Lactation Tools Discussed/Used Tools: Feeding cup;Other (comment) (curved-tip syringe) Initiated by:: RN Date initiated:: 06/11/14 DEBP Cue feedings at breast  Consult Status Consult Status: Follow-up Date: 06/12/14 Follow-up type: In-patient    Warrick ParisianBryant, Aitanna Haubner [facility] 06/11/2014, 9:41 PM

## 2014-06-11 NOTE — Lactation Note (Signed)
This note was copied from the chart of Rachel Jackson. Lactation Consultation Note; Follow up visit with this experienced BF mom. She reports that baby has been nursing well. Baby asleep in dad's arms. Mom trying to rest. No questions at present. To call prn  Patient Name: Rachel Brett AlbinoMaria Elrod ZOXWR'UToday's Date: 06/11/2014 Reason for consult: Follow-up assessment   Maternal Data Formula Feeding for Exclusion: No Does the patient have breastfeeding experience prior to this delivery?: Yes  Feeding    LATCH Score/Interventions                      Lactation Tools Discussed/Used     Consult Status Consult Status: PRN    Pamelia HoitWeeks, Kinza Gouveia D 06/11/2014, 1:34 PM

## 2014-06-12 ENCOUNTER — Inpatient Hospital Stay (HOSPITAL_COMMUNITY): Admission: RE | Admit: 2014-06-12 | Payer: Medicaid Other | Source: Ambulatory Visit

## 2014-06-12 NOTE — Discharge Instructions (Signed)

## 2014-06-12 NOTE — Progress Notes (Signed)
Ur chart review completed.  

## 2014-06-12 NOTE — Lactation Note (Addendum)
This note was copied from the chart of Rachel Byrd HesselbachMaria Jackson. Lactation Consultation Note  Patient Name: Rachel Brett AlbinoMaria Jackson ZOXWR'UToday's Date: 06/12/2014 Reason for consult: Follow-up assessment (see LC note [facility] ) Per mom and [name] , baby is being discharged with mom today on single photo tx . Per mom has a DEBP at home. Per mom  Has been using a curved tip syringe for supplementing with finger feeding. LC changed to SNS to keep the baby had the breast .  Unable to demo with a feeding ,bay last fed at 0830, and presently is sound asleep. [facility] and showed mom how to apply the  SNS tube on a finger , similar to a nipple. Per mom has understanding. Reviewed sore nipple and engorgement prevention and tx if needed. LC also mentioned to mom due to the baby being tx for jaundice to watch for non-nutritive sucking. LC stressed the importance of the baby receiving consistent  Calories , and to use plan C - artifical nipple if the baby is not latching well , ( broad based slow flow nipple ) . LC also stressed the importance of calling LC office once the baby  Has had the photo tx D/C and has got'en out of that sluggish mode to [facility]. Mom receptive to that recommendation.Mother informed of post-discharge support and given phone number to the lactation department, including services for phone call assistance; out-patient appointments; and breastfeeding support group. List of other breastfeeding resources in the community given in the handout. Encouraged mother to call for problems or concerns related to breastfeeding.   Maternal Data    Feeding Feeding Type:  (per mom the baby last fed 0830 with supplement , presently sleeping )  LATCH Score/Interventions                      Lactation Tools Discussed/Used     Consult Status Consult Status: Complete Date: 06/12/14    [name], [name] 06/12/2014, 11:10  AM

## 2014-06-12 NOTE — Lactation Note (Signed)
This note was copied from the chart of Rachel Byrd HesselbachMaria Jackson. Lactation Consultation Note Had 9% weight loss, is breast feeding longer and supplementing w/8015ml formula, post pumping. Baby cont. On Double photo therapy. Patient Name: Rachel Brett AlbinoMaria Arduini UEAVW'UToday's Date: 06/12/2014     Maternal Data    Feeding Feeding Type: Formula Length of feed: 20 min  LATCH Score/Interventions Latch: Grasps breast easily, tongue down, lips flanged, rhythmical sucking.  Audible Swallowing: Spontaneous and intermittent  Type of Nipple: Everted at rest and after stimulation  Comfort (Breast/Nipple): Filling, red/small blisters or bruises, mild/mod discomfort  Problem noted: Mild/Moderate discomfort Interventions (Mild/moderate discomfort): Comfort gels  Hold (Positioning): No assistance needed to correctly position infant at breast.  LATCH Score: 9  Lactation Tools Discussed/Used Tools: Other (comment) (gloved finger w/syringe)   Consult Status      Keishawna Carranza G 06/12/2014, 3:53 AM

## 2014-06-12 NOTE — Discharge Summary (Signed)
Obstetric Discharge Summary Reason for Admission: onset of labor Prenatal Procedures: none Intrapartum Procedures: spontaneous vaginal delivery Postpartum Procedures: none Complications-Operative and Postpartum: 2nd degree perineal laceration  Delivery Note At 3:03 PM a viable female was delivered via Vaginal, Spontaneous Delivery (Presentation: Left Occiput Anterior).  APGAR: 8, 9; weight 7 lb 3.9 oz (3285 g).   Placenta status: Intact, Spontaneous.  Cord: 3 vessels with the following complications: None.   Anesthesia: None  Episiotomy: None Lacerations: 2nd degree;Perineal Suture Repair: 3.0 vicryl rapide Est. Blood Loss (mL): 450  Mom to postpartum.  Baby to Couplet care / Skin to Skin.  Rachel Jackson Jackson 06/12/2014, 7:55 AM     Hospital Course:  Active Problems:   Active labor at term   Today: No acute events overnight.  Pt denies problems with ambulating, voiding or po intake.  She denies nausea or vomiting.  Pain is well controlled.  She has had flatus. She has not had bowel movement.  Lochia Minimal.  Plan for birth control is paraguard.  Method of Feeding: breast with supplement until milk comes in  Rachel Jackson is a 28 y.o. [id number] s/p TSVD.  Patient presented to [facility] in active labor and was admitted to L&D for unmedicated TSVD, 2nd degree laceration.  She has postpartum course that was uncomplicated including no problems with ambulating, PO intake, urination, pain, or bleeding. The pt feels ready to go home and  will be discharged with outpatient follow-up.    H/H: Lab Results  Component Value Date/Time   HGB 11.4* 06/11/2014  5:51 AM   HCT 33.5* 06/11/2014  5:51 AM    Discharge Diagnoses: Term Pregnancy-delivered  Discharge Information: Date: 06/12/2014 Activity: pelvic rest Diet: routine  Medications: Ibuprofen and Colace Breast feeding:  Yes Condition: stable Instructions: refer to handout Discharge to: home      Medication List         multivitamin-prenatal 27-0.8 MG Tabs tablet  Take 1 tablet by mouth daily at 12 noon.       Follow-up Information   Follow up In 6 weeks. (postpartum visit)       Perry MountACOSTA,Rachel Jackson ,MD OB Fellow 06/12/2014,8:44 AM

## 2014-06-18 NOTE — Discharge Summary (Signed)
Agree with note. 

## 2014-09-04 ENCOUNTER — Encounter (HOSPITAL_COMMUNITY): Payer: Self-pay | Admitting: *Deleted

## 2014-09-11 ENCOUNTER — Ambulatory Visit (INDEPENDENT_AMBULATORY_CARE_PROVIDER_SITE_OTHER): Payer: Medicaid Other | Admitting: Advanced Practice Midwife

## 2014-09-11 ENCOUNTER — Other Ambulatory Visit (HOSPITAL_COMMUNITY)
Admission: RE | Admit: 2014-09-11 | Discharge: 2014-09-11 | Disposition: A | Discharge status: Home / Self Care | Payer: Medicaid Other | Source: Ambulatory Visit | Attending: Advanced Practice Midwife | Admitting: Advanced Practice Midwife

## 2014-09-11 ENCOUNTER — Encounter: Payer: Self-pay | Admitting: Advanced Practice Midwife

## 2014-09-11 VITALS — BP 108/61 | HR 79 | Resp 16 | Ht 66.0 in | Wt 169.0 lb

## 2014-09-11 DIAGNOSIS — Z01411 Encounter for gynecological examination (general) (routine) with abnormal findings: Secondary | ICD-10-CM | POA: Insufficient documentation

## 2014-09-11 DIAGNOSIS — Z01812 Encounter for preprocedural laboratory examination: Secondary | ICD-10-CM | POA: Diagnosis not present

## 2014-09-11 DIAGNOSIS — Z30430 Encounter for insertion of intrauterine contraceptive device: Secondary | ICD-10-CM | POA: Insufficient documentation

## 2014-09-11 DIAGNOSIS — Z1272 Encounter for screening for malignant neoplasm of vagina: Secondary | ICD-10-CM

## 2014-09-11 LAB — POCT URINE PREGNANCY: Preg Test, Ur: NEGATIVE

## 2014-09-11 MED ORDER — PARAGARD INTRAUTERINE COPPER IU IUD
1.0000 | INTRAUTERINE_SYSTEM | Freq: Once | INTRAUTERINE | Status: AC
  Administered 2014-09-11: 1 via INTRAUTERINE

## 2014-09-11 NOTE — Patient Instructions (Signed)
Intrauterine Device Information An intrauterine device (IUD) is inserted into your uterus to prevent pregnancy. There are two types of IUDs available:   Copper IUD--This type of IUD is wrapped in copper wire and is placed inside the uterus. Copper makes the uterus and fallopian tubes produce a fluid that kills sperm. The copper IUD can stay in place for 10 years.  Hormone IUD--This type of IUD contains the hormone progestin (synthetic progesterone). The hormone thickens the cervical mucus and prevents sperm from entering the uterus. It also thins the uterine lining to prevent implantation of a fertilized egg. The hormone can weaken or kill the sperm that get into the uterus. One type of hormone IUD can stay in place for 5 years, and another type can stay in place for 3 years. Your health care provider will make sure you are a good candidate for a contraceptive IUD. Discuss with your health care provider the possible side effects.  ADVANTAGES OF AN INTRAUTERINE DEVICE  IUDs are highly effective, reversible, long acting, and low maintenance.   There are no estrogen-related side effects.   An IUD can be used when breastfeeding.   IUDs are not associated with weight gain.   The copper IUD works immediately after insertion.   The hormone IUD works right away if inserted within 7 days of your period starting. You will need to use a backup method of birth control for 7 days if the hormone IUD is inserted at any other time in your cycle.  The copper IUD does not interfere with your female hormones.   The hormone IUD can make heavy menstrual periods lighter and decrease cramping.   The hormone IUD can be used for 3 or 5 years.   The copper IUD can be used for 10 years. DISADVANTAGES OF AN INTRAUTERINE DEVICE  The hormone IUD can be associated with irregular bleeding patterns.   The copper IUD can make your menstrual flow heavier and more painful.   You may experience cramping and  vaginal bleeding after insertion.  Document Released: 09/23/2004 Document Revised: 06/22/2013 Document Reviewed: 04/10/2013 ExitCare Patient Information 2015 [employer]. This information is not intended to replace advice given to you by your health care provider. Make sure you discuss any questions you have with your health care provider.  

## 2014-09-11 NOTE — Progress Notes (Signed)
  Subjective:     Rachel Jackson is a 28 y.o. female who presents for a postpartum visit. She is 12 weeks  postpartum following a spontaneous vaginal delivery. I have fully reviewed the prenatal and intrapartum course. The delivery was at term. Outcome: spontaneous vaginal delivery. Anesthesia: none. Postpartum course has been uneventful. Baby's course has been uneventful. Baby is feeding by breast. Bleeding no bleeding. Bowel function is normal. Bladder function is normal. Patient is not sexually active. Contraception method is IUD. Postpartum depression screening: negative with score of 0  The following portions of the patient's history were reviewed and updated as appropriate: allergies, current medications, past family history, past medical history, past social history, past surgical history and problem list.  Review of Systems Pertinent items are noted in HPI.   Objective:    BP 108/61 mmHg  Pulse 79  Resp 16  Ht 5\' 6"  (1.676 m)  Wt 169 lb (76.658 kg)  BMI 27.29 kg/m2  LMP 08/11/2014  Breastfeeding? Yes  General:  alert, cooperative and no distress   Breasts:  inspection negative, no nipple discharge or bleeding, no masses or nodularity palpable  Lungs: clear to auscultation bilaterally  Heart:  regular rate and rhythm, S1, S2 normal, no murmur, click, rub or gallop  Abdomen: soft, non-tender; bowel sounds normal; no masses,  no organomegaly   Vulva:  normal  Vagina: normal vagina  Cervix:  bled after pap  Corpus: normal and normal size, contour, position, consistency, mobility, non-tender  Adnexa:  normal adnexa and no mass, fullness, tenderness  Rectal Exam: Not performed.        Patient identified, informed consent performed, signed copy in chart, time out was performed.  Urine pregnancy test negative.  Speculum placed in the vagina.  Cervix visualized.  Cleaned with Betadine x 2.  Uterus sounded to 6cm.  Paraguard IUD placed per manufacturer's recommendations.  Strings  trimmed to 3 cm.   Confirmed placement with US.  Patient given post procedure instructions and Mirena care card with expiration date.  Patient is asked to check IUD strings periodically and follow up in 4-6 weeks for IUD check. \ Assessment:     Normal postpartum exam. Pap smear done at today's visit.  Last years pap was ASCUS.  Plan:    1. Contraception: IUD 2. Discussed IUD Paraguard vs Mirena 3. Follow up in: 1 year or as needed.

## 2014-09-13 LAB — CYTOLOGY - PAP

## 2014-09-18 ENCOUNTER — Encounter: Payer: Self-pay | Admitting: *Deleted

## 2014-09-18 ENCOUNTER — Telehealth: Payer: Self-pay | Admitting: *Deleted

## 2014-09-18 NOTE — Telephone Encounter (Signed)
-----   Message from Aviva SignsMarie L Williams, CNM sent at 09/16/2014  4:06 AM EST ----- Regarding: normal pap Normal pap letter

## 2014-09-18 NOTE — Telephone Encounter (Signed)
Pt notified of normal pap smear results.

## 2014-10-09 ENCOUNTER — Ambulatory Visit (INDEPENDENT_AMBULATORY_CARE_PROVIDER_SITE_OTHER): Payer: Medicaid Other | Admitting: Advanced Practice Midwife

## 2014-10-09 ENCOUNTER — Encounter: Payer: Self-pay | Admitting: Advanced Practice Midwife

## 2014-10-09 VITALS — BP 101/67 | HR 74 | Ht 63.0 in | Wt 169.1 lb

## 2014-10-09 DIAGNOSIS — Z30431 Encounter for routine checking of intrauterine contraceptive device: Secondary | ICD-10-CM

## 2014-10-09 NOTE — Progress Notes (Signed)
IUD string check/tn

## 2014-10-09 NOTE — Patient Instructions (Signed)
Levonorgestrel intrauterine device (IUD) What is this medicine? LEVONORGESTREL IUD (LEE voe nor jes trel) is a contraceptive (birth control) device. The device is placed inside the uterus by a healthcare professional. It is used to prevent pregnancy and can also be used to treat heavy bleeding that occurs during your period. Depending on the device, it can be used for 3 to 5 years. This medicine may be used for other purposes; ask your health care provider or pharmacist if you have questions. COMMON BRAND NAME(S): LILETTA, Mirena, Skyla What should I tell my health care provider before I take this medicine? They need to know if you have any of these conditions: -abnormal Pap smear -cancer of the breast, uterus, or cervix -diabetes -endometritis -genital or pelvic infection now or in the past -have more than one sexual partner or your partner has more than one partner -heart disease -history of an ectopic or tubal pregnancy -immune system problems -IUD in place -liver disease or tumor -problems with blood clots or take blood-thinners -use intravenous drugs -uterus of unusual shape -vaginal bleeding that has not been explained -an unusual or allergic reaction to levonorgestrel, other hormones, silicone, or polyethylene, medicines, foods, dyes, or preservatives -pregnant or trying to get pregnant -breast-feeding How should I use this medicine? This device is placed inside the uterus by a health care professional. Talk to your pediatrician regarding the use of this medicine in children. Special care may be needed. Overdosage: If you think you have taken too much of this medicine contact a poison control center or emergency room at once. NOTE: This medicine is only for you. Do not share this medicine with others. What if I miss a dose? This does not apply. What may interact with this medicine? Do not take this medicine with any of the following  medications: -amprenavir -bosentan -fosamprenavir This medicine may also interact with the following medications: -aprepitant -barbiturate medicines for inducing sleep or treating seizures -bexarotene -griseofulvin -medicines to treat seizures like carbamazepine, ethotoin, felbamate, oxcarbazepine, phenytoin, topiramate -modafinil -pioglitazone -rifabutin -rifampin -rifapentine -some medicines to treat HIV infection like atazanavir, indinavir, lopinavir, nelfinavir, tipranavir, ritonavir -St. John's wort -warfarin This list may not describe all possible interactions. Give your health care provider a list of all the medicines, herbs, non-prescription drugs, or dietary supplements you use. Also tell them if you smoke, drink alcohol, or use illegal drugs. Some items may interact with your medicine. What should I watch for while using this medicine? Visit your doctor or health care professional for regular check ups. See your doctor if you or your partner has sexual contact with others, becomes HIV positive, or gets a sexual transmitted disease. This product does not protect you against HIV infection (AIDS) or other sexually transmitted diseases. You can check the placement of the IUD yourself by reaching up to the top of your vagina with clean fingers to feel the threads. Do not pull on the threads. It is a good habit to check placement after each menstrual period. Call your doctor right away if you feel more of the IUD than just the threads or if you cannot feel the threads at all. The IUD may come out by itself. You may become pregnant if the device comes out. If you notice that the IUD has come out use a backup birth control method like condoms and call your health care provider. Using tampons will not change the position of the IUD and are okay to use during your period. What side effects may   I notice from receiving this medicine? Side effects that you should report to your doctor or  health care professional as soon as possible: -allergic reactions like skin rash, itching or hives, swelling of the face, lips, or tongue -fever, flu-like symptoms -genital sores -high blood pressure -no menstrual period for 6 weeks during use -pain, swelling, warmth in the leg -pelvic pain or tenderness -severe or sudden headache -signs of pregnancy -stomach cramping -sudden shortness of breath -trouble with balance, talking, or walking -unusual vaginal bleeding, discharge -yellowing of the eyes or skin Side effects that usually do not require medical attention (report to your doctor or health care professional if they continue or are bothersome): -acne -breast pain -change in sex drive or performance -changes in weight -cramping, dizziness, or faintness while the device is being inserted -headache -irregular menstrual bleeding within first 3 to 6 months of use -nausea This list may not describe all possible side effects. Call your doctor for medical advice about side effects. You may report side effects to FDA at 1-800-FDA-1088. Where should I keep my medicine? This does not apply. NOTE: This sheet is a summary. It may not cover all possible information. If you have questions about this medicine, talk to your doctor, pharmacist, or health care provider.  2015, Elsevier/Gold Standard. (2011-11-20 13:54:04)  

## 2014-10-09 NOTE — Progress Notes (Signed)
   Subjective:    Patient ID: Rachel Jackson, female    DOB: 1986/04/26, 28 y.o.   MRN: [id number]  HPI This is a 28 y.o. female who presents for IUD string check. I placed a Paraguard IUD on 09/11/14.  She states she had some spotting and cramping the first week but none now.   Review of Systems  Constitutional: Negative for fever.  Gastrointestinal: Negative for abdominal pain.  Genitourinary: Negative for vaginal bleeding, difficulty urinating, menstrual problem and pelvic pain.       Objective:   Physical Exam  Constitutional: She is oriented to person, place, and time. She appears well-developed and well-nourished. No distress.  Cardiovascular: Normal rate.   Pulmonary/Chest: Effort normal.  Abdominal: Soft. There is no tenderness.  Genitourinary: Vagina normal. No vaginal discharge found.  String visible at cervical os Cervix closed  Musculoskeletal: Normal range of motion.  Neurological: She is alert and oriented to person, place, and time.  Skin: Skin is warm and dry.  Psychiatric: She has a normal mood and affect.          Assessment & Plan:  A:  Paraguard IUD in place  P:  Discussed signs to report       Return to office in 1 year or PRN

## 2016-03-21 ENCOUNTER — Emergency Department
Admission: EM | Admit: 2016-03-21 | Discharge: 2016-03-21 | Disposition: A | Discharge status: Home / Self Care | Payer: Medicaid Other | Source: Home / Self Care | Attending: Family Medicine | Admitting: Family Medicine

## 2016-03-21 DIAGNOSIS — L259 Unspecified contact dermatitis, unspecified cause: Secondary | ICD-10-CM

## 2016-03-21 MED ORDER — PREDNISONE 10 MG PO TABS: 40.0000 mg | ORAL_TABLET | Freq: Every day | ORAL | Status: DC

## 2016-03-21 MED ORDER — DEXAMETHASONE SODIUM PHOSPHATE 10 MG/ML IJ SOLN
10.0000 mg | Freq: Once | INTRAMUSCULAR | Status: AC
  Administered 2016-03-21: 10 mg via INTRAMUSCULAR

## 2016-03-21 MED ORDER — TRIAMCINOLONE ACETONIDE 0.1 % EX CREA: 1.0000 "application " | TOPICAL_CREAM | Freq: Two times a day (BID) | CUTANEOUS | Status: DC

## 2016-03-21 NOTE — ED Notes (Signed)
Pt was cutting bushes around the house Monday.  Rash started yesterday.  Started on hands, has migrated to face limbs, and trunk.  Took benadryl this am.

## 2016-03-21 NOTE — ED Provider Notes (Signed)
CSN: [id number]     Arrival date & time 03/21/16  1043 History   First MD Initiated Contact with Patient 03/21/16 1117     Chief Complaint  Patient presents with  . Rash   (Consider location/radiation/quality/duration/timing/severity/associated sxs/prior Treatment) HPI  The pt is a 30yo female presenting to [facility] with c/o gradually worsening erythematous pruritic rash to her arms, neck, trunk, and face since yesterday. She reports cutting bushes around her house 4 days ago. No hx of poison ivy in the past. She has been taking benadryl but no relief in itching. No contact with known allergens. Denies oral swelling or difficulty breathing or swallowing. No fever or nausea.   Past Medical History  Diagnosis Date  . Gluten intolerance   . Lactose intolerance    Past Surgical History  Procedure Laterality Date  . No past surgeries     Family History  Problem Relation Age of Onset  . Hypertension Father    Social History  Substance Use Topics  . Smoking status: Never Smoker   . Smokeless tobacco: Never Used  . Alcohol Use: 0.0 oz/week    0 Standard drinks or equivalent per week     Comment: occ   OB History    Gravida Para Term Preterm AB TAB SAB Ectopic Multiple Living   Review of Systems  Constitutional: Negative for fever and chills.  Respiratory: Negative for chest tightness, shortness of breath, wheezing and stridor.   Gastrointestinal: Negative for nausea and vomiting.  Musculoskeletal: Negative for myalgias and arthralgias.  Skin: Positive for rash. Negative for wound.    Allergies  Gluten meal; Lactose intolerance (gi); and Yellow dyes (non-tartrazine)  Home Medications   Prior to Admission medications   Medication Sig Start Date End Date Taking? Authorizing Provider  Multiple Vitamin (MULTIVITAMIN) tablet Take 1 tablet by mouth daily.    Historical Provider, MD  PARAGARD INTRAUTERINE COPPER IU by Intrauterine route.    Historical Provider, MD   predniSONE (DELTASONE) 10 MG tablet Take 4 tablets (40 mg total) by mouth daily with breakfast. For 5 days 03/21/16   Junius Finner, PA-C  triamcinolone cream (KENALOG) 0.1 % Apply 1 application topically 2 (two) times daily. 03/21/16   Junius Finner, PA-C   Meds Ordered and Administered this Visit   Medications  dexamethasone (DECADRON) injection 10 mg (10 mg Intramuscular Given 03/21/16 1127)    BP 112/71 mmHg  Pulse 86  Temp(Src) 98.8 F (37.1 C) (Oral)  Ht  (1.6 m)  Wt 166 lb 8 oz (75.524 kg)  BMI 29.50 kg/m2  SpO2 99%  LMP 03/20/2016 No data found.   Physical Exam  Constitutional: She is oriented to person, place, and time. She appears well-developed and well-nourished.  HENT:  Head: Normocephalic and atraumatic.  Eyes: EOM are normal.  Neck: Normal range of motion.  Cardiovascular: Normal rate.   Pulmonary/Chest: Effort normal.  Musculoskeletal: Normal range of motion.  Neurological: She is alert and oriented to person, place, and time.  Skin: Skin is warm and dry. Rash noted. There is erythema.  Diffuse erythematous papular rash with diffuse vesicles with dried yellow discharge on hands, arms, neck, and Left side of face. Rash at various stages in healing.   Psychiatric: She has a normal mood and affect. Her behavior is normal.  Nursing note and vitals reviewed.   ED Course  Procedures (including critical care time)  Labs Review Labs  Reviewed - No data to display  Imaging Review No results found.    MDM   1. Contact dermatitis    Pt c/o erythematous pruritic rash. Rash c/w contact dermatitis. No evidence of underlying infection or anaphylaxis at this time.  Tx in UC: decadron 10mg  IM Rx: Prednisone 40mg  for 5 days and triamcinolone cream.   Cannot prescribed Atarax as pt allergic to yellow dye. May take OTC antihistamines. F/u with PCP in 1 week if not improving, sooner if worsening. Patient verbalized understanding and agreement with treatment  plan.     Junius Finnerrin O'Malley, PA-C 03/21/16 1241

## 2016-03-21 NOTE — Discharge Instructions (Signed)
You were given a shot of decadron (a steroid) today to help with itching and swelling from a likely allergic reaction.  You have been prescribed 5 days of prednisone, an oral steroid.  You may start this medication tomorrow with breakfast.     Contact Dermatitis Dermatitis is redness, soreness, and swelling (inflammation) of the skin. Contact dermatitis is a reaction to certain substances that touch the skin. There are two types of contact dermatitis:   Irritant contact dermatitis. This type is caused by something that irritates your skin, such as dry hands from washing them too much. This type does not require previous exposure to the substance for a reaction to occur. This type is more common.  Allergic contact dermatitis. This type is caused by a substance that you are allergic to, such as a nickel allergy or poison ivy. This type only occurs if you have been exposed to the substance (allergen) before. Upon a repeat exposure, your body reacts to the substance. This type is less common. CAUSES  Many different substances can cause contact dermatitis. Irritant contact dermatitis is most commonly caused by exposure to:   Makeup.   Soaps.   Detergents.   Bleaches.   Acids.   Metal salts, such as nickel.  Allergic contact dermatitis is most commonly caused by exposure to:   Poisonous plants.   Chemicals.   Jewelry.   Latex.   Medicines.   Preservatives in products, such as clothing.  RISK FACTORS This condition is more likely to develop in:   People who have jobs that expose them to irritants or allergens.  People who have certain medical conditions, such as asthma or eczema.  SYMPTOMS  Symptoms of this condition may occur anywhere on your body where the irritant has touched you or is touched by you. Symptoms include:  Dryness or flaking.   Redness.   Cracks.   Itching.   Pain or a burning feeling.   Blisters.  Drainage of small amounts of blood  or clear fluid from skin cracks. With allergic contact dermatitis, there may also be swelling in areas such as the eyelids, mouth, or genitals.  DIAGNOSIS  This condition is diagnosed with a medical history and physical exam. A patch skin test may be performed to help determine the cause. If the condition is related to your job, you may need to see an occupational medicine specialist. TREATMENT Treatment for this condition includes figuring out what caused the reaction and protecting your skin from further contact. Treatment may also include:   Steroid creams or ointments. Oral steroid medicines may be needed in more severe cases.  Antibiotics or antibacterial ointments, if a skin infection is present.  Antihistamine lotion or an antihistamine taken by mouth to ease itching.  A bandage (dressing). HOME CARE INSTRUCTIONS Skin Care  Moisturize your skin as needed.   Apply cool compresses to the affected areas.  Try taking a bath with:  Epsom salts. Follow the instructions on the packaging. You can get these at your local pharmacy or grocery store.  Baking soda. Pour a small amount into the bath as directed by your health care provider.  Colloidal oatmeal. Follow the instructions on the packaging. You can get this at your local pharmacy or grocery store.  Try applying baking soda paste to your skin. Stir water into baking soda until it reaches a paste-like consistency.  Do not scratch your skin.  Bathe less frequently, such as every other day.  Bathe in lukewarm water. Avoid  using hot water. Medicines  Take or apply over-the-counter and prescription medicines only as told by your health care provider.   If you were prescribed an antibiotic medicine, take or apply your antibiotic as told by your health care provider. Do not stop using the antibiotic even if your condition starts to improve. General Instructions  Keep all follow-up visits as told by your health care  provider. This is important.  Avoid the substance that caused your reaction. If you do not know what caused it, keep a journal to try to track what caused it. Write down:  What you eat.  What cosmetic products you use.  What you drink.  What you wear in the affected area. This includes jewelry.  If you were given a dressing, take care of it as told by your health care provider. This includes when to change and remove it. SEEK MEDICAL CARE IF:   Your condition does not improve with treatment.  Your condition gets worse.  You have signs of infection such as swelling, tenderness, redness, soreness, or warmth in the affected area.  You have a fever.  You have new symptoms. SEEK IMMEDIATE MEDICAL CARE IF:   You have a severe headache, neck pain, or neck stiffness.  You vomit.  You feel very sleepy.  You notice red streaks coming from the affected area.  Your bone or joint underneath the affected area becomes painful after the skin has healed.  The affected area turns darker.  You have difficulty breathing.   This information is not intended to replace advice given to you by your health care provider. Make sure you discuss any questions you have with your health care provider.   Document Released: 10/17/2000 Document Revised: 07/11/2015 Document Reviewed: 03/07/2015 Elsevier Interactive Patient Education [employer]2016 [employer]

## 2016-03-26 ENCOUNTER — Encounter: Payer: Self-pay | Admitting: *Deleted

## 2016-03-26 ENCOUNTER — Emergency Department
Admission: EM | Admit: 2016-03-26 | Discharge: 2016-03-26 | Disposition: A | Discharge status: Home / Self Care | Payer: Medicaid Other | Source: Home / Self Care | Attending: Family Medicine | Admitting: Family Medicine

## 2016-03-26 DIAGNOSIS — L298 Other pruritus: Secondary | ICD-10-CM

## 2016-03-26 MED ORDER — METHYLPREDNISOLONE SODIUM SUCC 40 MG IJ SOLR
80.0000 mg | Freq: Once | INTRAMUSCULAR | Status: AC
  Administered 2016-03-26: 80 mg via INTRAMUSCULAR

## 2016-03-26 MED ORDER — CETIRIZINE HCL 10 MG PO TABS: 10.0000 mg | ORAL_TABLET | Freq: Every day | ORAL | Status: DC

## 2016-03-26 MED ORDER — PREDNISONE 10 MG PO TABS: 10.0000 mg | ORAL_TABLET | Freq: Every day | ORAL | Status: DC

## 2016-03-26 NOTE — ED Provider Notes (Signed)
CSN: [id number]     Arrival date & time 03/26/16  4098 History   First MD Initiated Contact with Patient 03/26/16 [id number]     Chief Complaint  Patient presents with  . Rash   (Consider location/radiation/quality/duration/timing/severity/associated sxs/prior Treatment) HPI  The pt is a 30yo female presenting to [facility] with c/o recurrent erythematous pruritic rash that is now the Right side of her trunk.  She was seen about 1 week ago for similar rash to face, hands and trunk. She was prescribed prednisone and had been taking benadryl.  Initial rash has nearly resolved but she is noticing worsening new areas of rash. She notes she recently moved and found a fabric softener ball in the bottom of her washer.  She knows she is allergic to fabric softener and now believes that's what has been causing her rash.  No one else in her household with a rash. Denies fever, chills, n/v/d. No oral swelling or SOB.  Past Medical History  Diagnosis Date  . Gluten intolerance   . Lactose intolerance    Past Surgical History  Procedure Laterality Date  . No past surgeries     Family History  Problem Relation Age of Onset  . Hypertension Father    Social History  Substance Use Topics  . Smoking status: Never Smoker   . Smokeless tobacco: Never Used  . Alcohol Use: 0.0 oz/week    0 Standard drinks or equivalent per week     Comment: occ   OB History    Gravida Para Term Preterm AB TAB SAB Ectopic Multiple Living   Review of Systems  Constitutional: Negative for fever and chills.  Respiratory: Negative for chest tightness, shortness of breath, wheezing and stridor.   Gastrointestinal: Negative for nausea, vomiting and diarrhea.  Musculoskeletal: Negative for myalgias and arthralgias.  Skin: Positive for color change and rash.    Allergies  Gluten meal; Lactose intolerance (gi); and Yellow dyes (non-tartrazine)  Home Medications   Prior to Admission medications   Medication  Sig Start Date End Date Taking? Authorizing Provider  cetirizine (ZYRTEC) 10 MG tablet Take 1 tablet (10 mg total) by mouth daily. 03/26/16   Junius Finner, PA-C  Multiple Vitamin (MULTIVITAMIN) tablet Take 1 tablet by mouth daily.    Historical Provider, MD  PARAGARD INTRAUTERINE COPPER IU by Intrauterine route.    Historical Provider, MD  predniSONE (DELTASONE) 10 MG tablet Take 1 tablet (10 mg total) by mouth daily with breakfast. Day 1: take 6 tabs with breakfast. Day 2-5: take 4 tabs with breakfast 03/26/16   Junius Finner, PA-C  triamcinolone cream (KENALOG) 0.1 % Apply 1 application topically 2 (two) times daily. 03/21/16   Junius Finner, PA-C   Meds Ordered and Administered this Visit   Medications  methylPREDNISolone sodium succinate (SOLU-MEDROL) 40 mg/mL injection 80 mg (not administered)    BP 110/73 mmHg  Pulse 90  Temp(Src) 98.4 F (36.9 C) (Oral)  Resp 16  Ht  (1.6 m)  Wt 165 lb (74.844 kg)  BMI 29.24 kg/m2  SpO2 98%  LMP 03/20/2016 No data found.   Physical Exam  Constitutional: She is oriented to person, place, and time. She appears well-developed and well-nourished.  HENT:  Head: Normocephalic and atraumatic.  Nose: Nose normal.  Mouth/Throat: Uvula is midline, oropharynx is clear and moist and mucous membranes are normal.  No oral swelling  Eyes: EOM are normal.  Neck: Normal range of motion.  Cardiovascular: Normal rate, regular rhythm and normal heart sounds.   Pulmonary/Chest: Effort normal. No respiratory distress. She has no wheezes.  Musculoskeletal: Normal range of motion.  Neurological: She is alert and oriented to person, place, and time.  Skin: Skin is warm and dry. Rash noted. There is erythema.  Erythematous maculopapular rash with diffuse wheals on Right side of trunk. Rash does blanch. Non-tender. No induration or fluctuance.   Psychiatric: She has a normal mood and affect. Her behavior is normal.  Nursing note and vitals reviewed.   ED  Course  Procedures (including critical care time)  Labs Review Labs Reviewed - No data to display  Imaging Review No results found.    MDM   1. Pruritic erythematous rash    Pt c/o recurrent rash.  She believes she has now found the cause of the rash and plans to wash her clothes differently until fabric softener can be cleared from her current washing machine.   No signs of underlying infection. No evidence of anaphylaxis.   Tx in UC: Solumedrol 80mg  IM Rx: cetirizine and prednisone F/u with PCP or Dermatologist in 1 week if not improving, or if rash keeps recurring. Patient verbalized understanding and agreement with treatment plan.     Junius Finnerrin O'Malley, PA-C 03/26/16 1050

## 2016-03-26 NOTE — Discharge Instructions (Signed)
°  You were given a shot of solumedrol (a steroid) today to help with itching and swelling from a likely allergic reaction.  You have been prescribed 5 days of prednisone, an oral steroid.  You may start this medication tomorrow with breakfast.   ° ° °

## 2016-03-26 NOTE — ED Notes (Signed)
Rachel HesselbachMaria is here today for a f/u on her rash from 03/21/16. She reports the rash has improved, but now it has spread to new areas.

## 2016-04-10 ENCOUNTER — Emergency Department (INDEPENDENT_AMBULATORY_CARE_PROVIDER_SITE_OTHER)
Admission: EM | Admit: 2016-04-10 | Discharge: 2016-04-10 | Disposition: A | Discharge status: Home / Self Care | Payer: Medicaid Other | Source: Home / Self Care | Attending: Family Medicine | Admitting: Family Medicine

## 2016-04-10 ENCOUNTER — Telehealth: Payer: Self-pay | Admitting: *Deleted

## 2016-04-10 ENCOUNTER — Encounter: Payer: Self-pay | Admitting: *Deleted

## 2016-04-10 DIAGNOSIS — H1013 Acute atopic conjunctivitis, bilateral: Secondary | ICD-10-CM | POA: Diagnosis not present

## 2016-04-10 MED ORDER — OLOPATADINE HCL 0.1 % OP SOLN: 1.0000 [drp] | Freq: Two times a day (BID) | OPHTHALMIC | Status: DC

## 2016-04-10 NOTE — ED Notes (Signed)
Pt c/o right eye tenderness redness and drainage x 2 days, left eye started today, worse than the right. Took IBF, wears contacts.

## 2016-04-10 NOTE — Discharge Instructions (Signed)
You may continue to take your cetirizine (Zyrtec) as this medication may also help with your eye irritation and any nasal congestion.  If symptoms worsen, please return for recheck of symptoms or follow up with optometrist or PCP.    Allergic Conjunctivitis Allergic conjunctivitis is inflammation of the clear membrane that covers the white part of your eye and the inner surface of your eyelid (conjunctiva), and it is caused by allergies. The blood vessels in the conjunctiva become inflamed, and this causes the eye to become red or pink, and it often causes itchiness in the eye. Allergic conjunctivitis cannot be spread by one person to another person (noncontagious). CAUSES This condition is caused by an allergic reaction. Common causes of an allergic reaction (allergens) include: 1. Dust. 2. Pollen. 3. Mold. 4. Animal dander or secretions. RISK FACTORS This condition is more likely to develop if you are exposed to high levels of allergens that cause the allergic reaction. This might include being outdoors when air pollen levels are high or being around animals that you are allergic to. SYMPTOMS Symptoms of this condition may include: 1. Eye redness. 2. Tearing of the eyes. 3. Watery eyes. 4. Itchy eyes. 5. Burning feeling in the eyes. 6. Clear drainage from the eyes. 7. Swollen eyelids. DIAGNOSIS This condition may be diagnosed by medical history and physical exam. If you have drainage from your eyes, it may be tested to rule out other causes of conjunctivitis. TREATMENT Treatment for this condition often includes medicines. These may be eye drops, ointments, or oral medicines. They may be prescription medicines or over-the-counter medicines. HOME CARE INSTRUCTIONS  Take or apply medicines only as directed by your health care provider.  Do not touch or rub your eyes.  Do not wear contact lenses until the inflammation is gone. Wear glasses instead.  Do not wear eye makeup until the  inflammation is gone.  Apply a cool, clean washcloth to your eye for 10-20 minutes, 3-4 times a day.  Try to avoid whatever allergen is causing the allergic reaction. SEEK MEDICAL CARE IF:  Your symptoms get worse.  You have pus draining from your eye.  You have new symptoms.  You have a fever.   This information is not intended to replace advice given to you by your health care provider. Make sure you discuss any questions you have with your health care provider.   Document Released: 01/10/2003 Document Revised: 11/10/2014 Document Reviewed: 08/01/2014 Elsevier Interactive Patient Education 2016 [employer].  How to Use Eye Drops and Eye Ointments HOW TO APPLY EYE DROPS Follow these steps when applying eye drops: 5. Wash your hands. 6. Tilt your head back. 7. Put a finger under your eye and use it to gently pull your lower lid downward. Keep that finger in place. 8. Using your other hand, hold the dropper between your thumb and index finger. 9. Position the dropper just over the edge of the lower lid. Hold it as close to your eye as you can without touching the dropper to your eye. 10. Steady your hand. One way to do this is to lean your index finger against your brow. 11. Look up. 12. Slowly and gently squeeze one drop of medicine into your eye. 13. Close your eye. 14. Place a finger between your lower eyelid and your nose. Press gently for 2 minutes. This increases the amount of time that the medicine is exposed to the eye. It also reduces side effects that can develop if the drop  gets into the bloodstream through the nose. HOW TO APPLY EYE OINTMENTS Follow these steps when applying eye ointments: 8. Wash your hands. 9. Put a finger under your eye and use it to gently pull your lower lid downward. Keep that finger in place. 10. Using your other hand, place the tip of the tube between your thumb and index finger with the remaining fingers braced against your cheek or  nose. 11. Hold the tube just over the edge of your lower lid without touching the tube to your lid or eyeball. 12. Look up. 13. Line the inner part of your lower lid with ointment. 14. Gently pull up on your upper lid and look down. This will force the ointment to spread over the surface of the eye. 15. Release the upper lid. 16. If you can, close your eyes for 1-2 minutes. Do not rub your eyes. If you applied the ointment correctly, your vision will be blurry for a few minutes. This is normal. ADDITIONAL INFORMATION  Make sure to use the eye drops or ointment as told by your health care provider.  If you have been told to use both eye drops and an eye ointment, apply the eye drops first, then wait 3-4 minutes before you apply the ointment.  Try not to touch the tip of the dropper or tube to your eye. A dropper or tube that has touched the eye can become contaminated.   This information is not intended to replace advice given to you by your health care provider. Make sure you discuss any questions you have with your health care provider.   Document Released: 01/26/2001 Document Revised: 03/06/2015 Document Reviewed: 10/16/2014 Elsevier Interactive Patient Education [employer]2016 Elsevier Inc.

## 2016-04-10 NOTE — ED Notes (Addendum)
Pts insurance does not over Patadine eye drops prescribed. Per Denny PeonErin, may use otc equivilant Zaditor, same instructions and keep in refrigerator. 1 drop both eyes 2x daily for 3-4 days, then as needed after. Patient agreed and verbalized understanding.

## 2016-04-10 NOTE — ED Provider Notes (Signed)
CSN: [id number]     Arrival date & time 04/10/16  0804 History   First MD Initiated Contact with Patient 04/10/16 [phone]     Chief Complaint  Patient presents with  . Eye Drainage   (Consider location/radiation/quality/duration/timing/severity/associated sxs/prior Treatment) HPI Comments: Pt c/o bilateral eye irritation with small amount of yellow crusting discharge in the morning. Her young daughter had similar symptoms the other day and was dx with allergic conjunctivitis, and seems to be improved. Reports mild nasal congestion.  Pt denies cough, sore throat, headache, ear pain, fever or chills. Denies trauma to eyes.  Patient is a 30 y.o. female presenting with conjunctivitis. The history is provided by the patient.  Conjunctivitis This is a new problem. The current episode started 2 days ago. The problem occurs daily. The problem has not changed since onset.Pertinent negatives include no headaches. Nothing aggravates the symptoms. She has tried nothing for the symptoms.    Past Medical History  Diagnosis Date  . Gluten intolerance   . Lactose intolerance    Past Surgical History  Procedure Laterality Date  . No past surgeries     Family History  Problem Relation Age of Onset  . Hypertension Father    Social History  Substance Use Topics  . Smoking status: Never Smoker   . Smokeless tobacco: Never Used  . Alcohol Use: 0.0 oz/week    0 Standard drinks or equivalent per week     Comment: occ   OB History    Gravida Para Term Preterm AB TAB SAB Ectopic Multiple Living   Review of Systems  Constitutional: Negative for fever and chills.  Eyes: Positive for pain ( mildly sore), discharge, redness and itching. Negative for photophobia and visual disturbance.  Neurological: Negative for dizziness, light-headedness and headaches.    Allergies  Gluten meal; Lactose intolerance (gi); and Yellow dyes (non-tartrazine)  Home Medications   Prior to Admission  medications   Medication Sig Start Date End Date Taking? Authorizing Provider  cetirizine (ZYRTEC) 10 MG tablet Take 1 tablet (10 mg total) by mouth daily. 03/26/16   Junius Finner, PA-C  Multiple Vitamin (MULTIVITAMIN) tablet Take 1 tablet by mouth daily.    Historical Provider, MD  olopatadine (PATANOL) 0.1 % ophthalmic solution Place 1 drop into both eyes 2 (two) times daily. For 3-4 days, then daily as needed for eye irritation. 04/10/16   Junius Finner, PA-C  PARAGARD INTRAUTERINE COPPER IU by Intrauterine route.    Historical Provider, MD   Meds Ordered and Administered this Visit  Medications - No data to display  BP 108/73 mmHg  Pulse 84  Temp(Src) 98.4 F (36.9 C) (Oral)  Wt 166 lb (75.297 kg)  SpO2 99%  LMP 03/26/2016 No data found.   Physical Exam  Constitutional: She is oriented to person, place, and time. She appears well-developed and well-nourished.  HENT:  Head: Normocephalic and atraumatic.  Eyes: EOM and lids are normal. Pupils are equal, round, and reactive to light. Right eye exhibits no chemosis and no discharge. Left eye exhibits no chemosis and no discharge. Right conjunctiva is injected. Right conjunctiva has no hemorrhage. Left conjunctiva is injected. Left conjunctiva has no hemorrhage. No scleral icterus.  Bilateral eyes: mildly injected. No appreciable discharge. Non-tender. No periorbital edema or erythema.  Neck: Normal range of motion.  Cardiovascular: Normal rate.   Pulmonary/Chest: Effort normal.  Musculoskeletal: Normal range of motion.  Neurological: She  is alert and oriented to person, place, and time.  Skin: Skin is warm and dry.  Psychiatric: She has a normal mood and affect. Her behavior is normal.  Nursing note and vitals reviewed.   ED Course  Procedures (including critical care time)  Labs Review Labs Reviewed - No data to display  Imaging Review No results found.   Visual Acuity Review  Right Eye Distance: 20/15 Left Eye  Distance: 20/25 Bilateral Distance: 20/15   MDM   1. Allergic conjunctivitis, bilateral    Pt c/o bilateral eye irritation. Exam c/w allergic conjunctivitis. Reassured pt no evidence of bacterial infection at this time.  Rx: Patanol  Encouraged f/u if not improving in 1 week, sooner if worsening.  Discussed symptoms to watch for that may indicate a bacterial infection. Pt info packet provided. Patient verbalized understanding and agreement with treatment plan.     Junius FinnerErin O'Malley, PA-C 04/10/16 [phone]

## 2016-12-16 ENCOUNTER — Emergency Department
Admission: EM | Admit: 2016-12-16 | Discharge: 2016-12-16 | Disposition: A | Discharge status: Home / Self Care | Payer: Medicaid Other | Source: Home / Self Care | Attending: Family Medicine | Admitting: Family Medicine

## 2016-12-16 DIAGNOSIS — J029 Acute pharyngitis, unspecified: Secondary | ICD-10-CM | POA: Diagnosis not present

## 2016-12-16 LAB — POCT RAPID STREP A (OFFICE): Rapid Strep A Screen: NEGATIVE

## 2016-12-16 NOTE — ED Provider Notes (Signed)
[name]    CSN: [id number] Arrival date & time: 12/16/16  1631     History   Chief Complaint Chief Complaint  Patient presents with  . Sore Throat    HPI Rachel Jackson is a 31 y.o. female.   Patient complains of sore throat, fatigue, nasal congestion, and myalgias for 3 days.  No cough or fever.   The history is provided by the patient.    Past Medical History:  Diagnosis Date  . Gluten intolerance   . Lactose intolerance     Patient Active Problem List   Diagnosis Date Noted  . Encounter for IUD insertion 09/11/2014  . Active labor at term 06/10/2014  . Atypical squamous cells of undetermined significance (ASCUS) on Papanicolaou smear of cervix 11/24/2013  . Supervision of other normal pregnancy 11/24/2013    Past Surgical History:  Procedure Laterality Date  . NO PAST SURGERIES      OB History    Gravida Para Term Preterm AB Living   3 2 2   1 2    SAB TAB Ectopic Multiple Live Births   1       2       Home Medications    Prior to Admission medications   Medication Sig Start Date End Date Taking? Authorizing Provider  cetirizine (ZYRTEC) 10 MG tablet Take 1 tablet (10 mg total) by mouth daily. 03/26/16   Junius Finner, PA-C  Multiple Vitamin (MULTIVITAMIN) tablet Take 1 tablet by mouth daily.    Historical Provider, MD  olopatadine (PATANOL) 0.1 % ophthalmic solution Place 1 drop into both eyes 2 (two) times daily. For 3-4 days, then daily as needed for eye irritation. 04/10/16   Junius Finner, PA-C  PARAGARD INTRAUTERINE COPPER IU by Intrauterine route.    Historical Provider, MD    Family History Family History  Problem Relation Age of Onset  . Hypertension Father     Social History Social History  Substance Use Topics  . Smoking status: Never Smoker  . Smokeless tobacco: Never Used  . Alcohol use 0.0 oz/week     Comment: occ     Allergies   Gluten meal; Lactose intolerance (gi); and Yellow dyes  (non-tartrazine)   Review of Systems Review of Systems + sore throat No cough No pleuritic pain No wheezing + nasal congestion + post-nasal drainage No sinus pain/pressure No itchy/red eyes No earache No hemoptysis No SOB No fever/chills No nausea No vomiting No abdominal pain No diarrhea No urinary symptoms No skin rash + fatigue + myalgias No headache Used OTC meds without relief   Physical Exam Triage Vital Signs ED Triage Vitals  Enc Vitals Group     BP 12/16/16 1713 120/64     Pulse Rate 12/16/16 1713 82     Resp --      Temp 12/16/16 1713 98.4 F (36.9 C)     Temp Source 12/16/16 1713 Oral     SpO2 12/16/16 1713 100 %     Weight 12/16/16 1713 174 lb (78.9 kg)     Height 12/16/16 1713 5\' 4"  (1.626 m)     Head Circumference --      Peak Flow --      Pain Score 12/16/16 1714 0     Pain Loc --      Pain Edu? --      Excl. in GC? --    No data found.   Updated Vital Signs BP 120/64 (BP Location: Left Arm)  Pulse 82   Temp 98.4 F (36.9 C) (Oral)   Ht 5\' 4"  (1.626 m)   Wt 174 lb (78.9 kg)   LMP 12/05/2016   SpO2 100%   BMI 29.87 kg/m   Visual Acuity Right Eye Distance:   Left Eye Distance:   Bilateral Distance:    Right Eye Near:   Left Eye Near:    Bilateral Near:     Physical Exam Nursing notes and Vital Signs reviewed. Appearance:  Patient appears stated age, and in no acute distress Eyes:  Pupils are equal, round, and reactive to light and accomodation.  Extraocular movement is intact.  Conjunctivae are not inflamed  Ears:  Canals normal.  Tympanic membranes normal.  Nose:  Mildly congested turbinates.  No sinus tenderness.   Pharynx:  Normal Neck:  Supple.  Mildly tender enlarged posterior/lateral nodes are palpated bilaterally  Lungs:  Clear to auscultation.  Breath sounds are equal.  Moving air well. Heart:  Regular rate and rhythm without murmurs, rubs, or gallops.  Abdomen:  Nontender without masses or hepatosplenomegaly.   Bowel sounds are present.  No CVA or flank tenderness.  Extremities:  No edema.  Skin:  No rash present.    UC Treatments / Results  Labs (all labs ordered are listed, but only abnormal results are displayed) Labs Reviewed  POCT RAPID STREP A (OFFICE) negative    EKG  EKG Interpretation None       Radiology No results found.  Procedures Procedures (including critical care time)  Medications Ordered in UC Medications - No data to display   Initial Impression / Assessment and Plan / UC Course  I have reviewed the triage vital signs and the nursing notes.  Pertinent labs & imaging results that were available during my care of the patient were reviewed by me and considered in my medical decision making (see chart for details).    There is no evidence of bacterial infection today.   Because son has strep pharyngitis, will send throat culture. Try warm salt water gargles for sore throat.  May take Ibuprofen 200mg , 4 tabs every 8 hours with food for pain Suspect early viral URI. If cold-like symptoms develop, try the following: Take plain guaifenesin (1200mg  extended release tabs such as Mucinex) twice daily, with plenty of water, for cough and congestion.  May add Pseudoephedrine (30mg , one or two every 4 to 6 hours) for sinus congestion.  Get adequate rest.   May take Delsym Cough Suppressant at bedtime for nighttime cough.  Stop all antihistamines for now, and other non-prescription cough/cold preparations. Followup with Family Doctor if not improved in one week.     Final Clinical Impressions(s) / UC Diagnoses   Final diagnoses:  Pharyngitis, unspecified etiology    New Prescriptions New Prescriptions   No medications on file     Lattie HawStephen A Josee Speece, MD 12/22/16 2214

## 2016-12-16 NOTE — ED Triage Notes (Signed)
Pt has had a scratch throat the last couple of days.

## 2016-12-16 NOTE — Discharge Instructions (Signed)
Try warm salt water gargles for sore throat.  May take Ibuprofen 200mg , 4 tabs every 8 hours with food for pain  If cold-like symptoms develop, try the following: Take plain guaifenesin (1200mg  extended release tabs such as Mucinex) twice daily, with plenty of water, for cough and congestion.  May add Pseudoephedrine (30mg , one or two every 4 to 6 hours) for sinus congestion.  Get adequate rest.   May take Delsym Cough Suppressant at bedtime for nighttime cough.  Stop all antihistamines for now, and other non-prescription cough/cold preparations.

## 2016-12-17 ENCOUNTER — Telehealth: Payer: Self-pay | Admitting: *Deleted

## 2016-12-17 LAB — STREP A DNA PROBE: GASP: NOT DETECTED

## 2016-12-17 NOTE — Telephone Encounter (Signed)
LM with Tcx results and to call back if she has any questions or concerns.  

## 2017-12-14 ENCOUNTER — Emergency Department
Admission: EM | Admit: 2017-12-14 | Discharge: 2017-12-14 | Disposition: A | Discharge status: Home / Self Care | Payer: Self-pay | Source: Home / Self Care | Attending: Family Medicine | Admitting: Family Medicine

## 2017-12-14 ENCOUNTER — Encounter: Payer: Self-pay | Admitting: Emergency Medicine

## 2017-12-14 DIAGNOSIS — H6501 Acute serous otitis media, right ear: Secondary | ICD-10-CM

## 2017-12-14 DIAGNOSIS — H6692 Otitis media, unspecified, left ear: Secondary | ICD-10-CM

## 2017-12-14 DIAGNOSIS — H7292 Unspecified perforation of tympanic membrane, left ear: Secondary | ICD-10-CM

## 2017-12-14 MED ORDER — AMOXICILLIN 875 MG PO TABS: 875.0000 mg | ORAL_TABLET | Freq: Two times a day (BID) | ORAL | 0 refills | Status: DC

## 2017-12-14 NOTE — ED Triage Notes (Signed)
URI x 1 week, Pressure in Right ear, Left ear started draining last night

## 2017-12-14 NOTE — Discharge Instructions (Signed)
May add Pseudoephedrine (30mg , one or two every 4 to 6 hours) for sinus congestion.  Get adequate rest.   May use Afrin nasal spray (or generic oxymetazoline) each morning for about 5 days and then discontinue.  Also recommend using saline nasal spray several times daily and saline nasal irrigation (AYR is a common brand).   Stop all antihistamines for now, and other non-prescription cough/cold preparations.

## 2017-12-14 NOTE — ED Provider Notes (Signed)
[facility]    CSN: [id number] Arrival date & time: 12/14/17  0912     History   Chief Complaint Chief Complaint  Patient presents with  . Otalgia    HPI Rachel Jackson is a 32 y.o. female.   Patient reports that she has had a cold for about a week. All symptoms were improving until yesterday when she developed ringing in both ears and last night developed bloody drainage from her left ear.   The history is provided by the patient.    Past Medical History:  Diagnosis Date  . Gluten intolerance   . Lactose intolerance     Patient Active Problem List   Diagnosis Date Noted  . Encounter for IUD insertion 09/11/2014  . Active labor at term 06/10/2014  . Atypical squamous cells of undetermined significance (ASCUS) on Papanicolaou smear of cervix 11/24/2013  . Supervision of other normal pregnancy 11/24/2013    Past Surgical History:  Procedure Laterality Date  . NO PAST SURGERIES      OB History    Gravida Para Term Preterm AB Living   3 2 2   1 2    SAB TAB Ectopic Multiple Live Births   1       2       Home Medications    Prior to Admission medications   Medication Sig Start Date End Date Taking? Authorizing Provider  amoxicillin (AMOXIL) 875 MG tablet Take 1 tablet (875 mg total) by mouth 2 (two) times daily. 12/14/17   Lattie HawBeese, Malahki Gasaway A, MD  Multiple Vitamin (MULTIVITAMIN) tablet Take 1 tablet by mouth daily.    [provider]  PARAGARD INTRAUTERINE COPPER IU by Intrauterine route.    [provider]    Family History Family History  Problem Relation Age of Onset  . Hypertension Father     Social History Social History   Tobacco Use  . Smoking status: Never Smoker  . Smokeless tobacco: Never Used  Substance Use Topics  . Alcohol use: Yes    Alcohol/week: 0.0 oz    Comment: occ  . Drug use: No     Allergies   Gluten meal; Lactose intolerance (gi); and Yellow dyes (non-tartrazine)   Review of  Systems Review of Systems + sore throat + cough No pleuritic pain No wheezing + nasal congestion + post-nasal drainage No sinus pain/pressure No itchy/red eyes + earache No hemoptysis No SOB No fever/chills No nausea No vomiting No abdominal pain No diarrhea No urinary symptoms No skin rash No fatigue No myalgias No headache Used OTC meds without relief   Physical Exam Triage Vital Signs ED Triage Vitals  Enc Vitals Group     BP 12/14/17 1129 112/76     Pulse Rate 12/14/17 1129 73     Resp --      Temp 12/14/17 1129 98 F (36.7 C)     Temp Source 12/14/17 1129 Oral     SpO2 12/14/17 1129 100 %     Weight --      Height --      Head Circumference --      Peak Flow --      Pain Score 12/14/17 1132 2     Pain Loc --      Pain Edu? --      Excl. in GC? --    No data found.  Updated Vital Signs BP 112/76 (BP Location: Right Arm)   Pulse 73   Temp 98 F (  36.7 C) (Oral)   LMP 11/27/2017 (Exact Date)   SpO2 100%   Visual Acuity Right Eye Distance:   Left Eye Distance:   Bilateral Distance:    Right Eye Near:   Left Eye Near:    Bilateral Near:     Physical Exam Nursing notes and Vital Signs reviewed. Appearance:  Patient appears stated age, and in no acute distress Eyes:  Pupils are equal, round, and reactive to light and accomodation.  Extraocular movement is intact.  Conjunctivae are not inflamed  Ears:  Canals normal.  Right tympanic membrane has a serous effusion.  Left tympanic membrane is erythematous and bulging. Nose:  Mildly congested turbinates.  No sinus tenderness.  Pharynx:  Normal Neck:  Supple.  Enlarged posterior/lateral nodes are palpated bilaterally, tender to palpation on the left.   Lungs:  Clear to auscultation.  Breath sounds are equal.  Moving air well. Heart:  Regular rate and rhythm without murmurs, rubs, or gallops.  Abdomen:  Nontender without masses or hepatosplenomegaly.  Bowel sounds are present.  No CVA or flank  tenderness.  Extremities:  No edema.  Skin:  No rash present.    UC Treatments / Results  Labs (all labs ordered are listed, but only abnormal results are displayed) Labs Reviewed - No data to display  EKG  EKG Interpretation None       Radiology No results found.  Procedures Procedures (including critical care time)  Medications Ordered in UC Medications - No data to display   Initial Impression / Assessment and Plan / UC Course  I have reviewed the triage vital signs and the nursing notes.  Pertinent labs & imaging results that were available during my care of the patient were reviewed by me and considered in my medical decision making (see chart for details).    Begin amoxicillin. May add Pseudoephedrine (30mg , one or two every 4 to 6 hours) for sinus congestion.  Get adequate rest.   May use Afrin nasal spray (or generic oxymetazoline) each morning for about 5 days and then discontinue.  Also recommend using saline nasal spray several times daily and saline nasal irrigation (AYR is a common brand).   Stop all antihistamines for now, and other non-prescription cough/cold preparations. Followup with ENT if not resolved 10 days.    Final Clinical Impressions(s) / UC Diagnoses   Final diagnoses:  Left otitis media with spontaneous rupture of eardrum  Right acute serous otitis media, recurrence not specified    ED Discharge Orders        Ordered    amoxicillin (AMOXIL) 875 MG tablet  2 times daily     12/14/17 1155           Lattie Haw, MD 12/21/17 2116

## 2022-03-14 ENCOUNTER — Emergency Department
Admission: RE | Admit: 2022-03-14 | Discharge: 2022-03-14 | Disposition: A | Discharge status: Home / Self Care | Payer: Medicaid Other | Source: Ambulatory Visit | Attending: Family Medicine | Admitting: Family Medicine

## 2022-03-14 VITALS — BP 130/91 | HR 106 | Temp 98.6°F | Resp 17

## 2022-03-14 DIAGNOSIS — N3090 Cystitis, unspecified without hematuria: Secondary | ICD-10-CM | POA: Diagnosis not present

## 2022-03-14 DIAGNOSIS — R350 Frequency of micturition: Secondary | ICD-10-CM | POA: Diagnosis not present

## 2022-03-14 LAB — POCT URINALYSIS DIP (MANUAL ENTRY)
Bilirubin, UA: NEGATIVE
Blood, UA: NEGATIVE
Glucose, UA: NEGATIVE mg/dL
Nitrite, UA: NEGATIVE
Spec Grav, UA: 1.025 (ref 1.010–1.025)
Urobilinogen, UA: 0.2 E.U./dL
pH, UA: 6 (ref 5.0–8.0)

## 2022-03-14 MED ORDER — CIPROFLOXACIN HCL 500 MG PO TABS: ORAL_TABLET | ORAL | 0 refills | Status: DC

## 2022-03-14 NOTE — ED Provider Notes (Signed)
?[facility] ? ? ? ?CSN: [id number] ?Arrival date & time: 03/14/22  1415 ? ? ?  ? ?History   ?Chief Complaint ?Chief Complaint  ?Patient presents with  ? Urinary Frequency  ? Dysuria  ? ? ?HPI ?Rachel Jackson is a 36 y.o. female.  ? ?Patient reports that she developed dysuria about 10 to 12 days ago.  She sought a virtual healthcare visit wherein she received Rx for Bactrim DS.  She improved significantly after five days, but her symptoms recurred last night, although not as severe.  She denies fevers, chills, and sweats, abdominal/pelvic pain, and nausea/vomiting.  Patient's last menstrual period was 02/22/2022 (exact date).  ? ?The history is provided by the patient.  ?Dysuria ?Pain quality:  Burning ?Pain severity:  Mild ?Onset quality:  Sudden ?Duration:  1 day ?Timing:  Constant ?Progression:  Worsening ?Chronicity:  Recurrent ?Recent urinary tract infections: yes   ?Relieved by:  Antibiotics ?Worsened by:  Nothing ?Ineffective treatments:  Antibiotics ?Urinary symptoms: frequent urination and hesitancy   ?Urinary symptoms: no discolored urine, no foul-smelling urine, no hematuria and no bladder incontinence   ?Associated symptoms: no abdominal pain, no fever, no flank pain, no nausea and no vomiting   ? ?Past Medical History:  ?Diagnosis Date  ? Gluten intolerance   ? Lactose intolerance   ? ? ?Patient Active Problem List  ? Diagnosis Date Noted  ? Encounter for IUD insertion 09/11/2014  ? Active labor at term 06/10/2014  ? Atypical squamous cells of undetermined significance (ASCUS) on Papanicolaou smear of cervix 11/24/2013  ? Supervision of other normal pregnancy 11/24/2013  ? ? ?Past Surgical History:  ?Procedure Laterality Date  ? NO PAST SURGERIES    ? ? ?OB History   ? ? Gravida  ?3  ? Para  ?2  ? Term  ?2  ? Preterm  ?   ? AB  ?1  ? Living  ?2  ?  ? ? SAB  ?1  ? IAB  ?   ? Ectopic  ?   ? Multiple  ?   ? Live Births  ?2  ?   ?  ?  ? ? ? ?Home Medications   ? ?Prior to Admission medications    ?Medication Sig Start Date End Date Taking? Authorizing Provider  ?ciprofloxacin (CIPRO) 500 MG tablet Take one tab PO once daily 03/14/22  Yes Gregoria Selvy, Tera Mater, MD  ?Multiple Vitamin (MULTIVITAMIN) tablet Take 1 tablet by mouth daily.    [provider]  ? ? ?Family History ?Family History  ?Problem Relation Age of Onset  ? Hypertension Father   ? ? ?Social History ?Social History  ? ?Tobacco Use  ? Smoking status: Never  ? Smokeless tobacco: Never  ?Substance Use Topics  ? Alcohol use: Yes  ?  Alcohol/week: 0.0 standard drinks  ?  Comment: occ  ? Drug use: No  ? ? ? ?Allergies   ?Gluten meal, Lactose intolerance (gi), and Yellow dyes (non-tartrazine) ? ? ?Review of Systems ?Review of Systems  ?Constitutional:  Negative for fever.  ?Gastrointestinal:  Negative for abdominal pain, nausea and vomiting.  ?Genitourinary:  Positive for dysuria. Negative for flank pain.  ? ? ?Physical Exam ?Triage Vital Signs ?ED Triage Vitals  ?Enc Vitals Group  ?   BP 03/14/22 1429 (!) 130/91  ?   Pulse Rate 03/14/22 1429 (!) 106  ?   Resp 03/14/22 1429 17  ?   Temp 03/14/22 1429 98.6 ?F (37 ?C)  ?  Temp Source 03/14/22 1429 Oral  ?   SpO2 03/14/22 1429 98 %  ?   Weight --   ?   Height --   ?   Head Circumference --   ?   Peak Flow --   ?   Pain Score 03/14/22 1430 0  ?   Pain Loc --   ?   Pain Edu? --   ?   Excl. in GC? --   ? ?No data found. ? ?Updated Vital Signs ?BP (!) 130/91 (BP Location: Right Arm)   Pulse (!) 106   Temp 98.6 ?F (37 ?C) (Oral)   Resp 17   LMP 02/22/2022 (Exact Date)   SpO2 98%  ? ?Visual Acuity ?Right Eye Distance:   ?Left Eye Distance:   ?Bilateral Distance:   ? ?Right Eye Near:   ?Left Eye Near:    ?Bilateral Near:    ? ?Physical Exam ?Nursing notes and Vital Signs reviewed. ?Appearance:  Patient appears stated age, and in no acute distress.    ?Eyes:  Pupils are equal, round, and reactive to light and accomodation.  Extraocular movement is intact.  Conjunctivae are not inflamed   ?Pharynx:   Normal; moist mucous membranes  ?Neck:  Supple.  No adenopathy ?Lungs:  Clear to auscultation.  Breath sounds are equal.  Moving air well. ?Heart:  Regular rate and rhythm without murmurs, rubs, or gallops.  ?Abdomen:  Nontender without masses or hepatosplenomegaly.  Bowel sounds are present.  No CVA or flank tenderness.  ?Extremities:  No edema.  ?Skin:  No rash present.    ? ?UC Treatments / Results  ?Labs ?(all labs ordered are listed, but only abnormal results are displayed) ?Labs Reviewed  ?POCT URINALYSIS DIP (MANUAL ENTRY) - Abnormal; Notable for the following components:  ?    Result Value  ? Ketones, POC UA trace (5) (*)   ? Protein Ur, POC trace (*)   ? Leukocytes, UA Small (1+) (*)   ? All other components within normal limits  ?URINE CULTURE  ? ? ?EKG ? ? ?Radiology ?No results found. ? ?Procedures ?Procedures (including critical care time) ? ?Medications Ordered in UC ?Medications - No data to display ? ?Initial Impression / Assessment and Plan / UC Course  ?I have reviewed the triage vital signs and the nursing notes. ? ?Pertinent labs & imaging results that were available during my care of the patient were reviewed by me and considered in my medical decision making (see chart for details). ? ?  ?Failure to improve with Bactrim DS. ?Urine culture pending. ?Begin Cipro. ?Followup with Family Doctor if not improved in five days. ? ?Final Clinical Impressions(s) / UC Diagnoses  ? ?Final diagnoses:  ?Urinary frequency  ?Cystitis  ? ? ? ?Discharge Instructions   ? ?  ?Increase fluid intake. ?If symptoms become significantly worse during the night or over the weekend, proceed to the local emergency room.  ? ? ? ?ED Prescriptions   ? ? Medication Sig Dispense Auth. Provider  ? ciprofloxacin (CIPRO) 500 MG tablet Take one tab PO once daily 5 tablet Lattie Haw, MD  ? ?  ? ? ?  ?Lattie Haw, MD ?03/14/22 1630 ? ?

## 2022-03-14 NOTE — ED Triage Notes (Signed)
Pt here today for continued UTI sxs. Had telehealth visit last week, tx with bactrim. Sxs resolved but urinary urgency and bladder pressure returned last night.  ?

## 2022-03-14 NOTE — Discharge Instructions (Signed)
Increase fluid intake. °If symptoms become significantly worse during the night or over the weekend, proceed to the local emergency room.  °

## 2022-03-16 LAB — URINE CULTURE
MICRO NUMBER:: 13391426
Result:: NO GROWTH
SPECIMEN QUALITY:: ADEQUATE

## 2022-05-17 ENCOUNTER — Other Ambulatory Visit: Payer: Self-pay

## 2022-05-17 ENCOUNTER — Emergency Department
Admission: RE | Admit: 2022-05-17 | Discharge: 2022-05-17 | Disposition: A | Discharge status: Home / Self Care | Payer: Medicaid Other | Source: Ambulatory Visit | Attending: Family Medicine | Admitting: Family Medicine

## 2022-05-17 VITALS — BP 109/73 | HR 80 | Temp 98.7°F | Resp 20 | Ht 64.0 in | Wt 206.8 lb

## 2022-05-17 DIAGNOSIS — K029 Dental caries, unspecified: Secondary | ICD-10-CM

## 2022-05-17 MED ORDER — AMOXICILLIN-POT CLAVULANATE 875-125 MG PO TABS: 1.0000 | ORAL_TABLET | Freq: Two times a day (BID) | ORAL | 0 refills | Status: AC

## 2022-05-17 NOTE — Discharge Instructions (Addendum)
Instructed patient to take medication as directed with food to completion.  Encouraged patient to increase daily water intake while taking this medication.  Advised patient to follow-up with dentist first thing Monday morning, 05/19/2022 for further evaluation.

## 2022-05-17 NOTE — ED Provider Notes (Signed)
[name]    CSN: [id number] Arrival date & time: 05/17/22  1257      History   Chief Complaint Chief Complaint  Patient presents with   Dental Problem    Possible dental infection. Fatigue, headache on one side, pain in face and jaw, pain back of neck one side, twinge in left chest, light headed, slight weakness left arm - Entered by patient    HPI Rachel Jackson is a 36 y.o. female.   HPI Pleasant 36 year old female presents with dental pain which is causing left-sided face pain and neck pain.  PMH significant for obesity and ASCUS.  Patient reports recent dental procedure at the end of June where broken infected tooth (upper left first molar) was removed, without root canal being performed.  Past Medical History:  Diagnosis Date   Gluten intolerance    Lactose intolerance     Patient Active Problem List   Diagnosis Date Noted   Encounter for IUD insertion 09/11/2014   Active labor at term 06/10/2014   Atypical squamous cells of undetermined significance (ASCUS) on Papanicolaou smear of cervix 11/24/2013   Supervision of other normal pregnancy 11/24/2013    Past Surgical History:  Procedure Laterality Date   NO PAST SURGERIES     TOOTH EXTRACTION      OB History     Gravida  3   Para  2   Term  2   Preterm      AB  1   Living  2      SAB  1   IAB      Ectopic      Multiple      Live Births  2            Home Medications    Prior to Admission medications   Medication Sig Start Date End Date Taking? Authorizing Provider  Aluminum & Magnesium Hydroxide (MAGNESIUM-ALUMINUM PO) Take by mouth.   Yes [provider]  amoxicillin-clavulanate (AUGMENTIN) 875-125 MG tablet Take 1 tablet by mouth 2 (two) times daily for 10 days. 05/17/22 05/27/22 Yes Rachel Iha, FNP  Multiple Vitamin (MULTIVITAMIN) tablet Take 1 tablet by mouth daily.   Yes [provider]  Probiotic Product (PROBIOTIC PO) Take by mouth.   Yes  [provider]    Family History Family History  Problem Relation Age of Onset   Thyroid disease Mother    Diabetes Mother    Hypertension Father     Social History Social History   Tobacco Use   Smoking status: Never   Smokeless tobacco: Never  Substance Use Topics   Alcohol use: Not Currently    Comment: occ   Drug use: No     Allergies   Gluten meal, Lactose intolerance (gi), and Yellow dyes (non-tartrazine)   Review of Systems Review of Systems  HENT:  Positive for dental problem.        Dental pain of upper left jaw with facial swelling for 2 days.  All other systems reviewed and are negative.    Physical Exam Triage Vital Signs ED Triage Vitals  Enc Vitals Group     BP 05/17/22 1336 109/73     Pulse Rate 05/17/22 1336 80     Resp 05/17/22 1336 20     Temp 05/17/22 1336 98.7 F (37.1 C)     Temp Source 05/17/22 1336 Oral     SpO2 05/17/22 1336 100 %     Weight 05/17/22 1331  206 lb 12.8 oz (93.8 kg)     Height 05/17/22 1331 5\' 4"  (1.626 m)     Head Circumference --      Peak Flow --      Pain Score 05/17/22 1331 3     Pain Loc --      Pain Edu? --      Excl. in GC? --    No data found.  Updated Vital Signs BP 109/73 (BP Location: Left Arm)   Pulse 80   Temp 98.7 F (37.1 C) (Oral)   Resp 20   Ht 5\' 4"  (1.626 m)   Wt 206 lb 12.8 oz (93.8 kg)   LMP 05/15/2022   SpO2 100%   BMI 35.50 kg/m    Physical Exam Vitals and nursing note reviewed.  Constitutional:      General: She is not in acute distress.    Appearance: Normal appearance. She is obese. She is not ill-appearing.  HENT:     Head: Normocephalic and atraumatic.     Right Ear: Tympanic membrane, ear canal and external ear normal.     Left Ear: Tympanic membrane, ear canal and external ear normal.     Mouth/Throat:     Mouth: Mucous membranes are moist.     Pharynx: Oropharynx is clear.     Comments: Left-sided upper jaw: First molar missing with erythematous,  indurated gingival mucosa noted  Eyes:     Extraocular Movements: Extraocular movements intact.     Conjunctiva/sclera: Conjunctivae normal.     Pupils: Pupils are equal, round, and reactive to light.  Cardiovascular:     Rate and Rhythm: Normal rate and regular rhythm.     Pulses: Normal pulses.     Heart sounds: Normal heart sounds. No murmur heard. Pulmonary:     Effort: Pulmonary effort is normal.     Breath sounds: Normal breath sounds. No wheezing, rhonchi or rales.  Musculoskeletal:     Cervical back: Normal range of motion and neck supple.  Skin:    General: Skin is warm and dry.  Neurological:     General: No focal deficit present.     Mental Status: She is alert and oriented to person, place, and time. Mental status is at baseline.      UC Treatments / Results  Labs (all labs ordered are listed, but only abnormal results are displayed) Labs Reviewed - No data to display  EKG   Radiology No results found.  Procedures Procedures (including critical care time)  Medications Ordered in UC Medications - No data to display  Initial Impression / Assessment and Plan / UC Course  I have reviewed the triage vital signs and the nursing notes.  Pertinent labs & imaging results that were available during my care of the patient were reviewed by me and considered in my medical decision making (see chart for details).     MDM: 1.  Pain due to dental caries-Rx'd Augmentin. Instructed patient to take medication as directed with food to completion.  Encouraged patient to increase daily water intake while taking this medication.  Advised patient to follow-up with dentist first thing Monday morning, 05/19/2022 for further evaluation.  Patient discharged home, hemodynamically stable.  Final Clinical Impressions(s) / UC Diagnoses   Final diagnoses:  Pain due to dental caries     Discharge Instructions      Instructed patient to take medication as directed with food to  completion.  Encouraged patient to increase daily water intake  while taking this medication.  Advised patient to follow-up with dentist first thing Monday morning, 05/19/2022 for further evaluation.     ED Prescriptions     Medication Sig Dispense Auth. Provider   amoxicillin-clavulanate (AUGMENTIN) 875-125 MG tablet Take 1 tablet by mouth 2 (two) times daily for 10 days. 20 tablet Rachel Iha, FNP      PDMP not reviewed this encounter.   Rachel Iha, FNP 05/17/22 1411

## 2022-05-17 NOTE — ED Triage Notes (Signed)
Dental pain which is causing some left side facial pain and neck pain. Pt states that she also has some nasal congestion on the left side of her nose as well. X2 days

## 2022-05-18 ENCOUNTER — Telehealth: Payer: Self-pay

## 2022-05-18 NOTE — Telephone Encounter (Signed)
TC to f/u w/ pt after yesterday's visit to [facility]. Pt states she is already feeling better after taking prescribed antibiotic and denies questions/concerns.

## 2023-09-02 ENCOUNTER — Ambulatory Visit: Payer: Medicaid Other | Admitting: Family Medicine

## 2023-09-02 ENCOUNTER — Encounter: Payer: Self-pay | Admitting: Family Medicine

## 2023-09-02 VITALS — BP 116/82 | HR 90 | Ht 64.0 in | Wt 200.8 lb

## 2023-09-02 DIAGNOSIS — Z13220 Encounter for screening for lipoid disorders: Secondary | ICD-10-CM | POA: Diagnosis not present

## 2023-09-02 DIAGNOSIS — R635 Abnormal weight gain: Secondary | ICD-10-CM | POA: Insufficient documentation

## 2023-09-02 DIAGNOSIS — F909 Attention-deficit hyperactivity disorder, unspecified type: Secondary | ICD-10-CM | POA: Diagnosis not present

## 2023-09-02 DIAGNOSIS — R4586 Emotional lability: Secondary | ICD-10-CM | POA: Diagnosis not present

## 2023-09-02 NOTE — Assessment & Plan Note (Signed)
Pt has concerns of mood swings, anxiety, irritability, and dry skin so we will go ahead and check TSH and estradiol and fsh levels to see if the cause of this is perimenopaus

## 2023-09-02 NOTE — Assessment & Plan Note (Signed)
Pt has noticed some weight gain and says she has not really changed any of her habits  - will get blood work to evaluate for metabolic cause

## 2023-09-02 NOTE — Assessment & Plan Note (Addendum)
-   pt has a reported pmh of ADHD and needs a referral for medication. Referral has been sent to psychiatry for adhd medication. Said she was diagnosed two years ago

## 2023-09-02 NOTE — Progress Notes (Signed)
Established patient visit   Patient: Rachel Jackson   DOB: 27-Jun-1986   37 y.o. Female  MRN: [id number] Visit Date: 09/02/2023  Today's healthcare provider: Charlton Amor, DO   Chief Complaint  Patient presents with   New Patient (Initial Visit)    Pt here to est PCP- also c/o  symptoms of  insomnia, dry skin and scalp, bodyaches, fatigue, mood swings and irritability. X couple of years  [place]lo wanting to see if can take of ADHD rx rf ( current provider no longer in network ) or get a referral to psychiatry if needed.     SUBJECTIVE    Chief Complaint  Patient presents with   New Patient (Initial Visit)    Pt here to est PCP- also c/o  symptoms of  insomnia, dry skin and scalp, bodyaches, fatigue, mood swings and irritability. X couple of years  [place]lo wanting to see if can take of ADHD rx rf ( current provider no longer in network ) or get a referral to psychiatry if needed.    HPI HPI     New Patient (Initial Visit)    Additional comments: Pt here to est PCP- also c/o  symptoms of  insomnia, dry skin and scalp, bodyaches, fatigue, mood swings and irritability. X couple of years  [place]lo wanting to see if can take of ADHD rx rf ( current provider no longer in network ) or get a referral to psychiatry if needed.       Last edited by Elizabeth Palau, LPN on 60/08/9322 11:12 AM.       Pt presents to establish care.   Has concerns of perimenopause with symptoms of fatigue, anxiety, irritability, dry skin, and weight gain.   Also needs a referral for ADHD medication   Pmh  HTN: mom and dad  DM: mom Cancers: grandmother-kidney, aunt-lymphoma, cousin-breast cancer   Review of Systems  Constitutional:  Positive for fatigue and unexpected weight change. Negative for activity change and fever.  Respiratory:  Negative for cough and shortness of breath.   Cardiovascular:  Negative for chest pain.  Gastrointestinal:  Negative for abdominal pain.  Genitourinary:  Negative  for difficulty urinating.  Psychiatric/Behavioral:         Irritability       Current Meds  Medication Sig   Aluminum & Magnesium Hydroxide (MAGNESIUM-ALUMINUM PO) Take by mouth.   amphetamine-dextroamphetamine (ADDERALL) 15 MG tablet Take 15 mg by mouth 2 (two) times daily.   Multiple Vitamin (MULTIVITAMIN) tablet Take 1 tablet by mouth daily.   Probiotic Product (PROBIOTIC PO) Take by mouth.    OBJECTIVE    BP 116/82   Pulse 90   Ht 5\' 4"  (1.626 m)   Wt 200 lb 12 oz (91.1 kg)   SpO2 99%   BMI 34.46 kg/m   Physical Exam Vitals and nursing note reviewed.  Constitutional:      General: She is not in acute distress.    Appearance: Normal appearance.  HENT:     Head: Normocephalic and atraumatic.     Right Ear: External ear normal.     Left Ear: External ear normal.     Nose: Nose normal.  Eyes:     Conjunctiva/sclera: Conjunctivae normal.  Cardiovascular:     Rate and Rhythm: Normal rate and regular rhythm.  Pulmonary:     Effort: Pulmonary effort is normal.     Breath sounds: Normal breath sounds.  Abdominal:     General:  Abdomen is flat. Bowel sounds are normal.     Palpations: Abdomen is soft.     Tenderness: There is no abdominal tenderness.  Neurological:     General: No focal deficit present.     Mental Status: She is alert and oriented to person, place, and time.  Psychiatric:        Mood and Affect: Mood normal.        Behavior: Behavior normal.        Thought Content: Thought content normal.        Judgment: Judgment normal.        ASSESSMENT & PLAN    Problem List Items Addressed This Visit       Other   Attention deficit hyperactivity disorder (ADHD) - Primary    - pt has a reported pmh of ADHD and needs a referral for medication. Referral has been sent to psychiatry for adhd medication. Said she was diagnosed two years ago      Relevant Orders   Ambulatory referral to Psychiatry   Mood swings    Pt has concerns of mood swings, anxiety,  irritability, and dry skin so we will go ahead and check TSH and estradiol and fsh levels to see if the cause of this is perimenopaus      Relevant Orders   Estradiol   FSH   Weight gain    Pt has noticed some weight gain and says she has not really changed any of her habits  - will get blood work to evaluate for metabolic cause       Relevant Orders   TSH + free T4   Estradiol   FSH   CBC   Basic Metabolic Panel (BMET)   Other Visit Diagnoses     Screening for hyperlipidemia       Relevant Orders   Lipid panel       No follow-ups on file.      No orders of the defined types were placed in this encounter.   Orders Placed This Encounter  Procedures   TSH + free T4   Estradiol   FSH   Lipid panel    Order Specific Question:   Has the patient fasted?    Answer:   No    Order Specific Question:   Release to patient    Answer:   Immediate   CBC   Basic Metabolic Panel (BMET)    Order Specific Question:   Has the patient fasted?    Answer:   No   Ambulatory referral to Psychiatry    Referral Priority:   Routine    Referral Type:   Psychiatric    Referral Reason:   Specialty Services Required    Requested Specialty:   Psychiatry    Number of Visits Requested:   1     Charlton Amor, DO  [facility] at [facility] [phone] (phone) [phone] (fax)  [facility]

## 2023-09-03 LAB — BASIC METABOLIC PANEL
BUN/Creatinine Ratio: 11 (ref 9–23)
BUN: 10 mg/dL (ref 6–20)
CO2: 22 mmol/L (ref 20–29)
Calcium: 9.2 mg/dL (ref 8.7–10.2)
Chloride: 102 mmol/L (ref 96–106)
Creatinine, Ser: 0.91 mg/dL (ref 0.57–1.00)
Glucose: 81 mg/dL (ref 70–99)
Potassium: 4.2 mmol/L (ref 3.5–5.2)
Sodium: 140 mmol/L (ref 134–144)
eGFR: 83 mL/min/{1.73_m2} (ref >59)

## 2023-09-03 LAB — TSH+FREE T4
Free T4: 1.23 ng/dL (ref 0.82–1.77)
TSH: 2.16 u[IU]/mL (ref 0.450–4.500)

## 2023-09-03 LAB — LIPID PANEL
Chol/HDL Ratio: 3.6 ratio (ref 0.0–4.4)
Cholesterol, Total: 159 mg/dL (ref 100–199)
HDL: 44 mg/dL (ref >39)
LDL Chol Calc (NIH): 71 mg/dL (ref 0–99)
Triglycerides: 273 mg/dL — ABNORMAL HIGH (ref 0–149)
VLDL Cholesterol Cal: 44 mg/dL — ABNORMAL HIGH (ref 5–40)

## 2023-09-03 LAB — CBC
Hematocrit: 45 % (ref 34.0–46.6)
Hemoglobin: 14 g/dL (ref 11.1–15.9)
MCH: 24.8 pg — ABNORMAL LOW (ref 26.6–33.0)
MCHC: 31.1 g/dL — ABNORMAL LOW (ref 31.5–35.7)
MCV: 80 fL (ref 79–97)
Platelets: 333 10*3/uL (ref 150–450)
RBC: 5.65 x10E6/uL — ABNORMAL HIGH (ref 3.77–5.28)
RDW: 15.6 % — ABNORMAL HIGH (ref 11.7–15.4)
WBC: 9.4 10*3/uL (ref 3.4–10.8)

## 2023-09-03 LAB — FOLLICLE STIMULATING HORMONE: FSH: 2.8 m[IU]/mL

## 2023-09-03 LAB — ESTRADIOL: Estradiol: 12.8 pg/mL

## 2023-09-11 ENCOUNTER — Telehealth: Payer: Self-pay | Admitting: *Deleted

## 2023-09-11 NOTE — Telephone Encounter (Signed)
Left patient a message to call and reschedule appointment on 09/21/2023 due to Dr. Penne Lash no longer being in the office that day.

## 2023-09-21 ENCOUNTER — Encounter: Payer: Medicaid Other | Admitting: Obstetrics and Gynecology

## 2023-09-30 ENCOUNTER — Other Ambulatory Visit (HOSPITAL_COMMUNITY)
Admission: RE | Admit: 2023-09-30 | Discharge: 2023-09-30 | Disposition: A | Discharge status: Home / Self Care | Payer: Medicaid Other | Source: Ambulatory Visit | Attending: Obstetrics and Gynecology | Admitting: Obstetrics and Gynecology

## 2023-09-30 ENCOUNTER — Encounter: Payer: Self-pay | Admitting: Obstetrics and Gynecology

## 2023-09-30 ENCOUNTER — Ambulatory Visit: Payer: Medicaid Other | Admitting: Obstetrics and Gynecology

## 2023-09-30 VITALS — BP 125/84 | HR 96 | Ht 64.0 in | Wt 199.0 lb

## 2023-09-30 DIAGNOSIS — N841 Polyp of cervix uteri: Secondary | ICD-10-CM | POA: Insufficient documentation

## 2023-09-30 DIAGNOSIS — R87610 Atypical squamous cells of undetermined significance on cytologic smear of cervix (ASC-US): Secondary | ICD-10-CM

## 2023-09-30 DIAGNOSIS — N939 Abnormal uterine and vaginal bleeding, unspecified: Secondary | ICD-10-CM

## 2023-09-30 DIAGNOSIS — Z01419 Encounter for gynecological examination (general) (routine) without abnormal findings: Secondary | ICD-10-CM | POA: Diagnosis present

## 2023-09-30 DIAGNOSIS — R102 Pelvic and perineal pain: Secondary | ICD-10-CM | POA: Diagnosis not present

## 2023-09-30 NOTE — Progress Notes (Signed)
   ANNUAL EXAM Patient name: Rachel Jackson MRN [id number]  Date of birth: 1986-10-15 Chief Complaint:   Annual Exam  History of Present Illness:   Tereza Morsch is a 37 y.o. [id number] with Patient's last menstrual period was 09/11/2023. being seen today for a routine annual exam.  Current complaints:   Spotting/bleeding after IC in certain positions. Also had heavy periods and was started on Nuvaring by telehealth urgent care.  Notes RLQ pain - had ruptured ovarian cyst on that side  Last pap 2015. Results were: NILM w/ HRHPV negative. H/O abnormal pap: yes ASCUS/HPV+ 2014 Last mammogram: never. Results were: N/A. Family h/o breast cancer: no Last colonoscopy: never. Results were: N/A. Family h/o colorectal cancer: no HPV vaccine: Unsure      No data to display               No data to display           Review of Systems:   Pertinent items are noted in HPI Denies any headaches, blurred vision, fatigue, shortness of breath, chest pain, abdominal pain, abnormal vaginal discharge/itching/odor/irritation, problems with periods, bowel movements, urination, or intercourse unless otherwise stated above. Pertinent History Reviewed:  Reviewed past medical,surgical, social and family history.  Reviewed problem list, medications and allergies. Physical Assessment:   Vitals:   09/30/23 1541  BP: 125/84  Pulse: 96  Weight: 199 lb (90.3 kg)  Height: 5\' 4"  (1.626 m)  Body mass index is 34.16 kg/m.        Physical Examination:   General appearance - well appearing, and in no distress  Mental status - alert, oriented to person, place, and time  Chest - respiratory effort normal  Heart - normal peripheral perfusion  Breasts - breasts appear normal, no suspicious masses, no skin or nipple changes or axillary nodes  Abdomen - soft, nontender, nondistended, no masses or organomegaly  Pelvic - VULVA: normal appearing vulva with no masses, tenderness or lesions  VAGINA: normal  appearing vagina with normal color and discharge, no lesions  CERVIX: cervical polyp noted. Removed with ring forceps after discussion of r/b and verbal consent obtained. Silver nitrate applied  Thin prep pap is done with HR HPV cotesting  Chaperone present for exam  No results found for this or any previous visit (from the past 24 hour(s)).  Assessment & Plan:  Well-Woman Exam Mammogram: @ 37yo, or sooner if problems Colonoscopy: @ 37yo, or sooner if problems Pap: Collected today Gardasil: Considering GC/CT: Collected HIV/HCV: Declined  Cervical polyp Removed without difficulty. Silver nitrate applied. Counseled pt to anticipate bleeding, reviewed return precautions for HVB/fevers -     Surgical pathology  Abnormal uterine bleeding (AUB) Pelvic pain Discussed focused eval today and that additional problem visits can be scheduled if needed GC/CT/trich collected with pap Discussed that cervical polyp may be source of her concerns. Will need to do more in depth evaluation depending on her test results and evolution of symptoms -     CBC -     TSH -     US PELVIC COMPLETE WITH TRANSVAGINAL; Future  Labs/procedures today:   Orders Placed This Encounter  Procedures   US PELVIC COMPLETE WITH TRANSVAGINAL   CBC   TSH   Meds: No orders of the defined types were placed in this encounter.  Follow-up: Return in about 1 year (around 09/29/2024) for annual exam or sooner as needed for pain/irregular bleeding.  Lennart Pall, MD 09/30/2023 6:32 PM

## 2023-10-01 LAB — CBC
Hematocrit: 42 % (ref 34.0–46.6)
Hemoglobin: 13 g/dL (ref 11.1–15.9)
MCH: 24.2 pg — ABNORMAL LOW (ref 26.6–33.0)
MCHC: 31 g/dL — ABNORMAL LOW (ref 31.5–35.7)
MCV: 78 fL — ABNORMAL LOW (ref 79–97)
Platelets: 347 10*3/uL (ref 150–450)
RBC: 5.37 x10E6/uL — ABNORMAL HIGH (ref 3.77–5.28)
RDW: 14.7 % (ref 11.7–15.4)
WBC: 14.2 10*3/uL — ABNORMAL HIGH (ref 3.4–10.8)

## 2023-10-01 LAB — TSH: TSH: 2.22 u[IU]/mL (ref 0.450–4.500)

## 2023-10-05 ENCOUNTER — Ambulatory Visit: Payer: Medicaid Other

## 2023-10-05 DIAGNOSIS — N939 Abnormal uterine and vaginal bleeding, unspecified: Secondary | ICD-10-CM | POA: Diagnosis not present

## 2023-10-05 DIAGNOSIS — R102 Pelvic and perineal pain: Secondary | ICD-10-CM

## 2023-10-05 LAB — CYTOLOGY - PAP
Chlamydia: NEGATIVE
Comment: NEGATIVE
Comment: NEGATIVE
Comment: NEGATIVE
Comment: NORMAL
Diagnosis: NEGATIVE
High risk HPV: NEGATIVE
Neisseria Gonorrhea: NEGATIVE
Trichomonas: NEGATIVE

## 2023-10-05 LAB — SURGICAL PATHOLOGY

## 2024-03-10 ENCOUNTER — Encounter: Payer: Self-pay | Admitting: Family Medicine
